# Patient Record
Sex: Male | Born: 1955 | Hispanic: No | Marital: Married
Health system: Southern US, Community
[De-identification: ages and names within clinical notes are randomized; demographics above are authoritative.]

---

## 2019-04-10 ENCOUNTER — Inpatient Hospital Stay (HOSPITAL_COMMUNITY)
Admission: EM | Admit: 2019-04-10 | Discharge: 2019-05-09 | DRG: 296 | Disposition: E | Payer: PRIVATE HEALTH INSURANCE | Attending: Pulmonary Disease | Admitting: Pulmonary Disease

## 2019-04-10 ENCOUNTER — Emergency Department (HOSPITAL_COMMUNITY): Payer: PRIVATE HEALTH INSURANCE

## 2019-04-10 ENCOUNTER — Other Ambulatory Visit: Payer: Self-pay

## 2019-04-10 ENCOUNTER — Inpatient Hospital Stay (HOSPITAL_COMMUNITY): Payer: PRIVATE HEALTH INSURANCE

## 2019-04-10 ENCOUNTER — Encounter (HOSPITAL_COMMUNITY): Payer: Self-pay | Admitting: Emergency Medicine

## 2019-04-10 DIAGNOSIS — R9431 Abnormal electrocardiogram [ECG] [EKG]: Secondary | ICD-10-CM | POA: Diagnosis present

## 2019-04-10 DIAGNOSIS — R0902 Hypoxemia: Secondary | ICD-10-CM

## 2019-04-10 DIAGNOSIS — R739 Hyperglycemia, unspecified: Secondary | ICD-10-CM | POA: Diagnosis present

## 2019-04-10 DIAGNOSIS — J329 Chronic sinusitis, unspecified: Secondary | ICD-10-CM | POA: Diagnosis present

## 2019-04-10 DIAGNOSIS — Z452 Encounter for adjustment and management of vascular access device: Secondary | ICD-10-CM | POA: Diagnosis not present

## 2019-04-10 DIAGNOSIS — J9621 Acute and chronic respiratory failure with hypoxia: Secondary | ICD-10-CM

## 2019-04-10 DIAGNOSIS — Z01818 Encounter for other preprocedural examination: Secondary | ICD-10-CM

## 2019-04-10 DIAGNOSIS — R0600 Dyspnea, unspecified: Secondary | ICD-10-CM | POA: Diagnosis not present

## 2019-04-10 DIAGNOSIS — G931 Anoxic brain damage, not elsewhere classified: Secondary | ICD-10-CM | POA: Diagnosis present

## 2019-04-10 DIAGNOSIS — Z66 Do not resuscitate: Secondary | ICD-10-CM

## 2019-04-10 DIAGNOSIS — E876 Hypokalemia: Secondary | ICD-10-CM | POA: Diagnosis present

## 2019-04-10 DIAGNOSIS — Z20828 Contact with and (suspected) exposure to other viral communicable diseases: Secondary | ICD-10-CM | POA: Diagnosis present

## 2019-04-10 DIAGNOSIS — A419 Sepsis, unspecified organism: Secondary | ICD-10-CM | POA: Diagnosis not present

## 2019-04-10 DIAGNOSIS — Z978 Presence of other specified devices: Secondary | ICD-10-CM

## 2019-04-10 DIAGNOSIS — Z515 Encounter for palliative care: Secondary | ICD-10-CM

## 2019-04-10 DIAGNOSIS — Z79899 Other long term (current) drug therapy: Secondary | ICD-10-CM

## 2019-04-10 DIAGNOSIS — E872 Acidosis, unspecified: Secondary | ICD-10-CM

## 2019-04-10 DIAGNOSIS — R402112 Coma scale, eyes open, never, at arrival to emergency department: Secondary | ICD-10-CM | POA: Diagnosis present

## 2019-04-10 DIAGNOSIS — J690 Pneumonitis due to inhalation of food and vomit: Secondary | ICD-10-CM

## 2019-04-10 DIAGNOSIS — J80 Acute respiratory distress syndrome: Secondary | ICD-10-CM | POA: Diagnosis not present

## 2019-04-10 DIAGNOSIS — J9601 Acute respiratory failure with hypoxia: Secondary | ICD-10-CM | POA: Diagnosis not present

## 2019-04-10 DIAGNOSIS — J9690 Respiratory failure, unspecified, unspecified whether with hypoxia or hypercapnia: Secondary | ICD-10-CM

## 2019-04-10 DIAGNOSIS — N179 Acute kidney failure, unspecified: Secondary | ICD-10-CM

## 2019-04-10 DIAGNOSIS — R680 Hypothermia, not associated with low environmental temperature: Secondary | ICD-10-CM | POA: Diagnosis present

## 2019-04-10 DIAGNOSIS — G932 Benign intracranial hypertension: Secondary | ICD-10-CM | POA: Diagnosis not present

## 2019-04-10 DIAGNOSIS — I469 Cardiac arrest, cause unspecified: Secondary | ICD-10-CM | POA: Diagnosis present

## 2019-04-10 DIAGNOSIS — R6520 Severe sepsis without septic shock: Secondary | ICD-10-CM | POA: Diagnosis not present

## 2019-04-10 DIAGNOSIS — Z91018 Allergy to other foods: Secondary | ICD-10-CM

## 2019-04-10 DIAGNOSIS — R402312 Coma scale, best motor response, none, at arrival to emergency department: Secondary | ICD-10-CM | POA: Diagnosis present

## 2019-04-10 DIAGNOSIS — J189 Pneumonia, unspecified organism: Secondary | ICD-10-CM | POA: Diagnosis not present

## 2019-04-10 DIAGNOSIS — U071 COVID-19: Secondary | ICD-10-CM | POA: Diagnosis not present

## 2019-04-10 DIAGNOSIS — R402212 Coma scale, best verbal response, none, at arrival to emergency department: Secondary | ICD-10-CM | POA: Diagnosis present

## 2019-04-10 DIAGNOSIS — Z8249 Family history of ischemic heart disease and other diseases of the circulatory system: Secondary | ICD-10-CM

## 2019-04-10 DIAGNOSIS — R8271 Bacteriuria: Secondary | ICD-10-CM

## 2019-04-10 DIAGNOSIS — R578 Other shock: Secondary | ICD-10-CM | POA: Diagnosis present

## 2019-04-10 DIAGNOSIS — J188 Other pneumonia, unspecified organism: Secondary | ICD-10-CM

## 2019-04-10 LAB — POCT I-STAT 7, (LYTES, BLD GAS, ICA,H+H)
Acid-Base Excess: 1 mmol/L (ref 0.0–2.0)
Acid-Base Excess: 2 mmol/L (ref 0.0–2.0)
Acid-Base Excess: 1 mmol/L (ref 0.0–2.0)
Acid-base deficit: 2 mmol/L (ref 0.0–2.0)
Acid-base deficit: 1 mmol/L (ref 0.0–2.0)
Bicarbonate: 22.4 mmol/L (ref 20.0–28.0)
Bicarbonate: 21.9 mmol/L (ref 20.0–28.0)
Bicarbonate: 21.8 mmol/L (ref 20.0–28.0)
Bicarbonate: 23 mmol/L (ref 20.0–28.0)
Bicarbonate: 23.2 mmol/L (ref 20.0–28.0)
Bicarbonate: 22.6 mmol/L (ref 20.0–28.0)
Bicarbonate: 24.1 mmol/L (ref 20.0–28.0)
Calcium, Ion: 0.93 mmol/L — ABNORMAL LOW (ref 1.15–1.40)
Calcium, Ion: 0.94 mmol/L — ABNORMAL LOW (ref 1.15–1.40)
Calcium, Ion: 0.87 mmol/L — CL (ref 1.15–1.40)
Calcium, Ion: 0.85 mmol/L — CL (ref 1.15–1.40)
Calcium, Ion: 0.87 mmol/L — CL (ref 1.15–1.40)
Calcium, Ion: 0.86 mmol/L — CL (ref 1.15–1.40)
Calcium, Ion: 0.87 mmol/L — CL (ref 1.15–1.40)
HCT: 37 % — ABNORMAL LOW (ref 39.0–52.0)
HCT: 40 % (ref 39.0–52.0)
HCT: 39 % (ref 39.0–52.0)
HCT: 37 % — ABNORMAL LOW (ref 39.0–52.0)
HCT: 37 % — ABNORMAL LOW (ref 39.0–52.0)
HCT: 34 % — ABNORMAL LOW (ref 39.0–52.0)
HCT: 36 % — ABNORMAL LOW (ref 39.0–52.0)
Hemoglobin: 12.6 g/dL — ABNORMAL LOW (ref 13.0–17.0)
Hemoglobin: 13.6 g/dL (ref 13.0–17.0)
Hemoglobin: 13.3 g/dL (ref 13.0–17.0)
Hemoglobin: 12.6 g/dL — ABNORMAL LOW (ref 13.0–17.0)
Hemoglobin: 12.6 g/dL — ABNORMAL LOW (ref 13.0–17.0)
Hemoglobin: 11.6 g/dL — ABNORMAL LOW (ref 13.0–17.0)
Hemoglobin: 12.2 g/dL — ABNORMAL LOW (ref 13.0–17.0)
O2 Saturation: 100 %
O2 Saturation: 85 %
O2 Saturation: 96 %
O2 Saturation: 98 %
O2 Saturation: 99 %
O2 Saturation: 99 %
O2 Saturation: 97 %
Patient temperature: 32.2
Patient temperature: 32.2
Patient temperature: 32.8
Patient temperature: 33.1
Patient temperature: 33.2
Potassium: 3.5 mmol/L (ref 3.5–5.1)
Potassium: 3 mmol/L — ABNORMAL LOW (ref 3.5–5.1)
Potassium: 2.4 mmol/L — CL (ref 3.5–5.1)
Potassium: 3 mmol/L — ABNORMAL LOW (ref 3.5–5.1)
Potassium: 3.3 mmol/L — ABNORMAL LOW (ref 3.5–5.1)
Potassium: 2.8 mmol/L — ABNORMAL LOW (ref 3.5–5.1)
Potassium: 3.7 mmol/L (ref 3.5–5.1)
Sodium: 132 mmol/L — ABNORMAL LOW (ref 135–145)
Sodium: 134 mmol/L — ABNORMAL LOW (ref 135–145)
Sodium: 136 mmol/L (ref 135–145)
Sodium: 137 mmol/L (ref 135–145)
Sodium: 137 mmol/L (ref 135–145)
Sodium: 139 mmol/L (ref 135–145)
Sodium: 138 mmol/L (ref 135–145)
TCO2: 23 mmol/L (ref 22–32)
TCO2: 23 mmol/L (ref 22–32)
TCO2: 23 mmol/L (ref 22–32)
TCO2: 24 mmol/L (ref 22–32)
TCO2: 24 mmol/L (ref 22–32)
TCO2: 24 mmol/L (ref 22–32)
TCO2: 25 mmol/L (ref 22–32)
pCO2 arterial: 35.1 mmHg (ref 32.0–48.0)
pCO2 arterial: 32.2 mmHg (ref 32.0–48.0)
pCO2 arterial: 21.4 mmHg — ABNORMAL LOW (ref 32.0–48.0)
pCO2 arterial: 23 mmHg — ABNORMAL LOW (ref 32.0–48.0)
pCO2 arterial: 23 mmHg — ABNORMAL LOW (ref 32.0–48.0)
pCO2 arterial: 25.7 mmHg — ABNORMAL LOW (ref 32.0–48.0)
pCO2 arterial: 27.5 mmHg — ABNORMAL LOW (ref 32.0–48.0)
pH, Arterial: 7.413 (ref 7.350–7.450)
pH, Arterial: 7.441 (ref 7.350–7.450)
pH, Arterial: 7.598 — ABNORMAL HIGH (ref 7.350–7.450)
pH, Arterial: 7.59 — ABNORMAL HIGH (ref 7.350–7.450)
pH, Arterial: 7.598 — ABNORMAL HIGH (ref 7.350–7.450)
pH, Arterial: 7.538 — ABNORMAL HIGH (ref 7.350–7.450)
pH, Arterial: 7.537 — ABNORMAL HIGH (ref 7.350–7.450)
pO2, Arterial: 396 mmHg — ABNORMAL HIGH (ref 83.0–108.0)
pO2, Arterial: 48 mmHg — ABNORMAL LOW (ref 83.0–108.0)
pO2, Arterial: 53 mmHg — ABNORMAL LOW (ref 83.0–108.0)
pO2, Arterial: 70 mmHg — ABNORMAL LOW (ref 83.0–108.0)
pO2, Arterial: 84 mmHg (ref 83.0–108.0)
pO2, Arterial: 104 mmHg (ref 83.0–108.0)
pO2, Arterial: 63 mmHg — ABNORMAL LOW (ref 83.0–108.0)

## 2019-04-10 LAB — COMPREHENSIVE METABOLIC PANEL
ALT: 56 U/L — ABNORMAL HIGH (ref 0–44)
AST: 79 U/L — ABNORMAL HIGH (ref 15–41)
Albumin: 3.4 g/dL — ABNORMAL LOW (ref 3.5–5.0)
Alkaline Phosphatase: 64 U/L (ref 38–126)
Anion gap: 22 — ABNORMAL HIGH (ref 5–15)
BUN: 13 mg/dL (ref 8–23)
CO2: 20 mmol/L — ABNORMAL LOW (ref 22–32)
Calcium: 8.2 mg/dL — ABNORMAL LOW (ref 8.9–10.3)
Chloride: 90 mmol/L — ABNORMAL LOW (ref 98–111)
Creatinine, Ser: 1.64 mg/dL — ABNORMAL HIGH (ref 0.61–1.24)
GFR calc Af Amer: 50 mL/min — ABNORMAL LOW (ref >60)
GFR calc non Af Amer: 44 mL/min — ABNORMAL LOW (ref >60)
Glucose, Bld: 253 mg/dL — ABNORMAL HIGH (ref 70–99)
Potassium: 3.9 mmol/L (ref 3.5–5.1)
Sodium: 132 mmol/L — ABNORMAL LOW (ref 135–145)
Total Bilirubin: 0.8 mg/dL (ref 0.3–1.2)
Total Protein: 6.5 g/dL (ref 6.5–8.1)

## 2019-04-10 LAB — BASIC METABOLIC PANEL
Anion gap: 13 (ref 5–15)
Anion gap: 10 (ref 5–15)
BUN: 12 mg/dL (ref 8–23)
BUN: 12 mg/dL (ref 8–23)
CO2: 23 mmol/L (ref 22–32)
CO2: 23 mmol/L (ref 22–32)
Calcium: 6.8 mg/dL — ABNORMAL LOW (ref 8.9–10.3)
Calcium: 6.6 mg/dL — ABNORMAL LOW (ref 8.9–10.3)
Chloride: 99 mmol/L (ref 98–111)
Chloride: 104 mmol/L (ref 98–111)
Creatinine, Ser: 1.13 mg/dL (ref 0.61–1.24)
Creatinine, Ser: 1.01 mg/dL (ref 0.61–1.24)
GFR calc Af Amer: >60 mL/min (ref >60)
GFR calc Af Amer: >60 mL/min (ref >60)
GFR calc non Af Amer: >60 mL/min (ref >60)
GFR calc non Af Amer: >60 mL/min (ref >60)
Glucose, Bld: 150 mg/dL — ABNORMAL HIGH (ref 70–99)
Glucose, Bld: 106 mg/dL — ABNORMAL HIGH (ref 70–99)
Potassium: 3.1 mmol/L — ABNORMAL LOW (ref 3.5–5.1)
Potassium: 3.2 mmol/L — ABNORMAL LOW (ref 3.5–5.1)
Sodium: 135 mmol/L (ref 135–145)
Sodium: 137 mmol/L (ref 135–145)

## 2019-04-10 LAB — URINALYSIS, ROUTINE W REFLEX MICROSCOPIC
Bilirubin Urine: NEGATIVE
Glucose, UA: >=500 mg/dL — AB
Ketones, ur: NEGATIVE mg/dL
Leukocytes,Ua: NEGATIVE
Nitrite: NEGATIVE
Protein, ur: >=300 mg/dL — AB
Specific Gravity, Urine: 1.014 (ref 1.005–1.030)
WBC, UA: >50 WBC/hpf — ABNORMAL HIGH (ref 0–5)
pH: 6 (ref 5.0–8.0)

## 2019-04-10 LAB — CBC WITH DIFFERENTIAL/PLATELET
Abs Immature Granulocytes: 0.44 10*3/uL — ABNORMAL HIGH (ref 0.00–0.07)
Basophils Absolute: 0.1 10*3/uL (ref 0.0–0.1)
Basophils Relative: 1 %
Eosinophils Absolute: 0.1 10*3/uL (ref 0.0–0.5)
Eosinophils Relative: 1 %
HCT: 42.1 % (ref 39.0–52.0)
Hemoglobin: 12.9 g/dL — ABNORMAL LOW (ref 13.0–17.0)
Immature Granulocytes: 4 %
Lymphocytes Relative: 25 %
Lymphs Abs: 2.6 10*3/uL (ref 0.7–4.0)
MCH: 30.9 pg (ref 26.0–34.0)
MCHC: 30.6 g/dL (ref 30.0–36.0)
MCV: 100.7 fL — ABNORMAL HIGH (ref 80.0–100.0)
Monocytes Absolute: 0.6 10*3/uL (ref 0.1–1.0)
Monocytes Relative: 6 %
Neutro Abs: 6.6 10*3/uL (ref 1.7–7.7)
Neutrophils Relative %: 63 %
Platelets: 217 10*3/uL (ref 150–400)
RBC: 4.18 MIL/uL — ABNORMAL LOW (ref 4.22–5.81)
RDW: 13.2 % (ref 11.5–15.5)
WBC: 10.4 10*3/uL (ref 4.0–10.5)
nRBC: 0.7 % — ABNORMAL HIGH (ref 0.0–0.2)

## 2019-04-10 LAB — POCT I-STAT, CHEM 8
BUN: 15 mg/dL (ref 8–23)
Calcium, Ion: 0.86 mmol/L — CL (ref 1.15–1.40)
Chloride: 98 mmol/L (ref 98–111)
Creatinine, Ser: 1 mg/dL (ref 0.61–1.24)
Glucose, Bld: 224 mg/dL — ABNORMAL HIGH (ref 70–99)
HCT: 41 % (ref 39.0–52.0)
Hemoglobin: 13.9 g/dL (ref 13.0–17.0)
Potassium: 2.5 mmol/L — CL (ref 3.5–5.1)
Sodium: 136 mmol/L (ref 135–145)
TCO2: 24 mmol/L (ref 22–32)

## 2019-04-10 LAB — PHOSPHORUS: Phosphorus: <1 mg/dL — CL (ref 2.5–4.6)

## 2019-04-10 LAB — GLUCOSE, CAPILLARY
Glucose-Capillary: 197 mg/dL — ABNORMAL HIGH (ref 70–99)
Glucose-Capillary: 188 mg/dL — ABNORMAL HIGH (ref 70–99)
Glucose-Capillary: 205 mg/dL — ABNORMAL HIGH (ref 70–99)
Glucose-Capillary: 167 mg/dL — ABNORMAL HIGH (ref 70–99)
Glucose-Capillary: 152 mg/dL — ABNORMAL HIGH (ref 70–99)
Glucose-Capillary: 106 mg/dL — ABNORMAL HIGH (ref 70–99)
Glucose-Capillary: 82 mg/dL (ref 70–99)
Glucose-Capillary: 103 mg/dL — ABNORMAL HIGH (ref 70–99)
Glucose-Capillary: 99 mg/dL (ref 70–99)
Glucose-Capillary: 107 mg/dL — ABNORMAL HIGH (ref 70–99)
Glucose-Capillary: 102 mg/dL — ABNORMAL HIGH (ref 70–99)
Glucose-Capillary: 96 mg/dL (ref 70–99)

## 2019-04-10 LAB — APTT
aPTT: 31 seconds (ref 24–36)
aPTT: 102 seconds — ABNORMAL HIGH (ref 24–36)

## 2019-04-10 LAB — HEPARIN LEVEL (UNFRACTIONATED)
Heparin Unfractionated: 0.41 IU/mL (ref 0.30–0.70)
Heparin Unfractionated: 0.65 IU/mL (ref 0.30–0.70)

## 2019-04-10 LAB — TROPONIN I
Troponin I: 0.05 ng/mL (ref <0.03)
Troponin I: 0.09 ng/mL (ref <0.03)
Troponin I: 0.13 ng/mL (ref <0.03)
Troponin I: 0.15 ng/mL (ref <0.03)
Troponin I: 0.12 ng/mL (ref <0.03)

## 2019-04-10 LAB — I-STAT CHEM 8, ED
BUN: 16 mg/dL (ref 8–23)
Calcium, Ion: 0.76 mmol/L — CL (ref 1.15–1.40)
Chloride: 97 mmol/L — ABNORMAL LOW (ref 98–111)
Creatinine, Ser: 1.4 mg/dL — ABNORMAL HIGH (ref 0.61–1.24)
Glucose, Bld: 261 mg/dL — ABNORMAL HIGH (ref 70–99)
HCT: 41 % (ref 39.0–52.0)
Hemoglobin: 13.9 g/dL (ref 13.0–17.0)
Potassium: 3.6 mmol/L (ref 3.5–5.1)
Sodium: 127 mmol/L — ABNORMAL LOW (ref 135–145)
TCO2: 20 mmol/L — ABNORMAL LOW (ref 22–32)

## 2019-04-10 LAB — D-DIMER, QUANTITATIVE: D-Dimer, Quant: 5.85 ug/mL-FEU — ABNORMAL HIGH (ref 0.00–0.50)

## 2019-04-10 LAB — PROCALCITONIN: Procalcitonin: <0.1 ng/mL

## 2019-04-10 LAB — PROTIME-INR
INR: 1.1 (ref 0.8–1.2)
INR: 1.3 — ABNORMAL HIGH (ref 0.8–1.2)
Prothrombin Time: 14.2 seconds (ref 11.4–15.2)
Prothrombin Time: 16.1 seconds — ABNORMAL HIGH (ref 11.4–15.2)

## 2019-04-10 LAB — SARS CORONAVIRUS 2 BY RT PCR (HOSPITAL ORDER, PERFORMED IN ~~LOC~~ HOSPITAL LAB): SARS Coronavirus 2: NEGATIVE

## 2019-04-10 LAB — MAGNESIUM: Magnesium: 1.5 mg/dL — ABNORMAL LOW (ref 1.7–2.4)

## 2019-04-10 LAB — LACTATE DEHYDROGENASE: LDH: 343 U/L — ABNORMAL HIGH (ref 98–192)

## 2019-04-10 LAB — CBG MONITORING, ED: Glucose-Capillary: 203 mg/dL — ABNORMAL HIGH (ref 70–99)

## 2019-04-10 LAB — LACTIC ACID, PLASMA
Lactic Acid, Venous: 10.6 mmol/L (ref 0.5–1.9)
Lactic Acid, Venous: 8.2 mmol/L (ref 0.5–1.9)

## 2019-04-10 LAB — TRIGLYCERIDES: Triglycerides: 123 mg/dL (ref <150)

## 2019-04-10 LAB — HIV ANTIBODY (ROUTINE TESTING W REFLEX): HIV Screen 4th Generation wRfx: NONREACTIVE

## 2019-04-10 LAB — FERRITIN: Ferritin: 163 ng/mL (ref 24–336)

## 2019-04-10 LAB — C-REACTIVE PROTEIN: CRP: 1.2 mg/dL — ABNORMAL HIGH (ref <1.0)

## 2019-04-10 LAB — FIBRINOGEN: Fibrinogen: 436 mg/dL (ref 210–475)

## 2019-04-10 LAB — CREATININE, URINE, RANDOM: Creatinine, Urine: 23.48 mg/dL

## 2019-04-10 LAB — BRAIN NATRIURETIC PEPTIDE: B Natriuretic Peptide: 475.1 pg/mL — ABNORMAL HIGH (ref 0.0–100.0)

## 2019-04-10 LAB — MRSA PCR SCREENING: MRSA by PCR: NEGATIVE

## 2019-04-10 MED ORDER — CISATRACURIUM BOLUS VIA INFUSION
0.1000 mg/kg | Freq: Once | INTRAVENOUS | Status: AC
  Administered 2019-04-10: 8.2 mg via INTRAVENOUS
  Filled 2019-04-10: qty 9

## 2019-04-10 MED ORDER — HEPARIN (PORCINE) 25000 UT/250ML-% IV SOLN
900.0000 [IU]/h | INTRAVENOUS | Status: DC
  Administered 2019-04-10 (×2): 650 [IU]/h via INTRAVENOUS
  Administered 2019-04-11: 17:00:00 550 [IU]/h via INTRAVENOUS
  Administered 2019-04-14: 05:00:00 900 [IU]/h via INTRAVENOUS
  Filled 2019-04-10 (×4): qty 250

## 2019-04-10 MED ORDER — SODIUM BICARBONATE 8.4 % IV SOLN
50.0000 meq | Freq: Once | INTRAVENOUS | Status: AC
  Administered 2019-04-10: 50 meq via INTRAVENOUS

## 2019-04-10 MED ORDER — ROCURONIUM BROMIDE 50 MG/5ML IV SOLN
70.0000 mg | Freq: Once | INTRAVENOUS | Status: AC
  Administered 2019-04-10: 70 mg via INTRAVENOUS

## 2019-04-10 MED ORDER — SODIUM CHLORIDE 0.9 % IV SOLN
INTRAVENOUS | Status: DC | PRN
  Administered 2019-04-10: 250 mL via INTRAVENOUS

## 2019-04-10 MED ORDER — EPINEPHRINE PF 1 MG/ML IJ SOLN
1.0000 mg | Freq: Once | INTRAMUSCULAR | Status: AC
  Administered 2019-04-10: 01:00:00 1 mg via INTRAVENOUS

## 2019-04-10 MED ORDER — ASPIRIN 300 MG RE SUPP
300.0000 mg | RECTAL | Status: AC
  Administered 2019-04-10: 300 mg via RECTAL
  Filled 2019-04-10: qty 1

## 2019-04-10 MED ORDER — FENTANYL BOLUS VIA INFUSION
50.0000 ug | INTRAVENOUS | Status: DC | PRN
  Administered 2019-04-10: 50 ug via INTRAVENOUS
  Filled 2019-04-10: qty 50

## 2019-04-10 MED ORDER — SODIUM CHLORIDE 0.9 % IV SOLN
INTRAVENOUS | Status: DC | PRN
  Administered 2019-04-10: 500 mL via INTRAVENOUS

## 2019-04-10 MED ORDER — NOREPINEPHRINE 4 MG/250ML-% IV SOLN
INTRAVENOUS | Status: AC
  Administered 2019-04-10: 2 ug/min via INTRAVENOUS
  Filled 2019-04-10: qty 250

## 2019-04-10 MED ORDER — POTASSIUM PHOSPHATES 15 MMOLE/5ML IV SOLN
30.0000 mmol | Freq: Once | INTRAVENOUS | Status: AC
  Administered 2019-04-10: 30 mmol via INTRAVENOUS
  Filled 2019-04-10 (×2): qty 10

## 2019-04-10 MED ORDER — VANCOMYCIN HCL 10 G IV SOLR
1500.0000 mg | INTRAVENOUS | Status: DC
  Filled 2019-04-10: qty 1500

## 2019-04-10 MED ORDER — VITAL AF 1.2 CAL PO LIQD
1000.0000 mL | ORAL | Status: DC
  Administered 2019-04-10: 16:00:00 1000 mL

## 2019-04-10 MED ORDER — SODIUM CHLORIDE 0.9 % IV SOLN
INTRAVENOUS | Status: DC
  Administered 2019-04-10 – 2019-04-11 (×3): via INTRAVENOUS

## 2019-04-10 MED ORDER — NOREPINEPHRINE 4 MG/250ML-% IV SOLN
0.0000 ug/min | INTRAVENOUS | Status: DC
  Administered 2019-04-10: 02:00:00 2 ug/min via INTRAVENOUS

## 2019-04-10 MED ORDER — CHLORHEXIDINE GLUCONATE 0.12% ORAL RINSE (MEDLINE KIT)
15.0000 mL | Freq: Two times a day (BID) | OROMUCOSAL | Status: DC
  Administered 2019-04-10 – 2019-04-16 (×13): 15 mL via OROMUCOSAL

## 2019-04-10 MED ORDER — INSULIN ASPART 100 UNIT/ML ~~LOC~~ SOLN
0.0000 [IU] | Freq: Three times a day (TID) | SUBCUTANEOUS | Status: DC
  Administered 2019-04-10: 3 [IU] via SUBCUTANEOUS

## 2019-04-10 MED ORDER — MIDAZOLAM BOLUS VIA INFUSION
2.0000 mg | INTRAVENOUS | Status: DC | PRN
  Administered 2019-04-10 (×3): 2 mg via INTRAVENOUS
  Filled 2019-04-10: qty 2

## 2019-04-10 MED ORDER — VANCOMYCIN HCL 10 G IV SOLR
1500.0000 mg | Freq: Once | INTRAVENOUS | Status: AC
  Administered 2019-04-10: 1500 mg via INTRAVENOUS
  Filled 2019-04-10: qty 1500

## 2019-04-10 MED ORDER — ETOMIDATE 2 MG/ML IV SOLN
20.0000 mg | Freq: Once | INTRAVENOUS | Status: AC
  Administered 2019-04-10: 20 mg via INTRAVENOUS

## 2019-04-10 MED ORDER — ORAL CARE MOUTH RINSE
15.0000 mL | OROMUCOSAL | Status: DC
  Administered 2019-04-10 – 2019-04-16 (×61): 15 mL via OROMUCOSAL

## 2019-04-10 MED ORDER — SODIUM CHLORIDE 0.9 % IV BOLUS
1000.0000 mL | Freq: Once | INTRAVENOUS | Status: AC
  Administered 2019-04-10: 02:00:00 1000 mL via INTRAVENOUS

## 2019-04-10 MED ORDER — FENTANYL 2500MCG IN NS 250ML (10MCG/ML) PREMIX INFUSION
100.0000 ug/h | INTRAVENOUS | Status: DC
  Administered 2019-04-10: 100 ug/h via INTRAVENOUS
  Administered 2019-04-10 – 2019-04-12 (×5): 300 ug/h via INTRAVENOUS
  Filled 2019-04-10 (×6): qty 250

## 2019-04-10 MED ORDER — SODIUM CHLORIDE 0.9 % IV SOLN
1.0000 ug/kg/min | INTRAVENOUS | Status: DC
  Administered 2019-04-10: 06:00:00 1 ug/kg/min via INTRAVENOUS
  Administered 2019-04-11: 1.5 ug/kg/min via INTRAVENOUS
  Filled 2019-04-10 (×2): qty 20

## 2019-04-10 MED ORDER — MIDAZOLAM 50MG/50ML (1MG/ML) PREMIX INFUSION
2.0000 mg/h | INTRAVENOUS | Status: DC
  Administered 2019-04-10: 10 mg/h via INTRAVENOUS
  Administered 2019-04-10: 4 mg/h via INTRAVENOUS
  Administered 2019-04-10: 10 mg/h via INTRAVENOUS
  Administered 2019-04-10: 06:00:00 2 mg/h via INTRAVENOUS
  Administered 2019-04-11 (×5): 10 mg/h via INTRAVENOUS
  Administered 2019-04-12: 4 mg/h via INTRAVENOUS
  Filled 2019-04-10 (×10): qty 50

## 2019-04-10 MED ORDER — POTASSIUM CHLORIDE 10 MEQ/50ML IV SOLN
10.0000 meq | INTRAVENOUS | Status: AC
  Administered 2019-04-10 (×6): 10 meq via INTRAVENOUS
  Filled 2019-04-10 (×6): qty 50

## 2019-04-10 MED ORDER — PIPERACILLIN-TAZOBACTAM 3.375 G IVPB
3.3750 g | Freq: Three times a day (TID) | INTRAVENOUS | Status: DC
  Administered 2019-04-10 – 2019-04-16 (×19): 3.375 g via INTRAVENOUS
  Filled 2019-04-10 (×17): qty 50

## 2019-04-10 MED ORDER — NOREPINEPHRINE 4 MG/250ML-% IV SOLN
0.0000 ug/min | INTRAVENOUS | Status: DC
  Administered 2019-04-10 (×4): 6 ug/min via INTRAVENOUS
  Filled 2019-04-10 (×2): qty 250

## 2019-04-10 MED ORDER — HEPARIN BOLUS VIA INFUSION
1000.0000 [IU] | Freq: Once | INTRAVENOUS | Status: AC
  Administered 2019-04-10: 1000 [IU] via INTRAVENOUS
  Filled 2019-04-10: qty 1000

## 2019-04-10 MED ORDER — PIPERACILLIN-TAZOBACTAM 3.375 G IVPB 30 MIN: 3.3750 g | Freq: Three times a day (TID) | INTRAVENOUS | Status: DC

## 2019-04-10 MED ORDER — PIPERACILLIN-TAZOBACTAM 3.375 G IVPB 30 MIN
3.3750 g | Freq: Once | INTRAVENOUS | Status: AC
  Administered 2019-04-10: 03:00:00 3.375 g via INTRAVENOUS
  Filled 2019-04-10: qty 50

## 2019-04-10 MED ORDER — CHLORHEXIDINE GLUCONATE CLOTH 2 % EX PADS
6.0000 | MEDICATED_PAD | Freq: Every day | CUTANEOUS | Status: DC
  Administered 2019-04-10: 6 via TOPICAL

## 2019-04-10 MED ORDER — CISATRACURIUM BOLUS VIA INFUSION
0.0500 mg/kg | INTRAVENOUS | Status: DC | PRN
  Filled 2019-04-10: qty 5

## 2019-04-10 MED ORDER — STERILE WATER FOR INJECTION IV SOLN
INTRAVENOUS | Status: DC
  Administered 2019-04-10 (×2): via INTRAVENOUS
  Filled 2019-04-10: qty 850

## 2019-04-10 MED ORDER — MIDAZOLAM HCL 2 MG/2ML IJ SOLN
2.0000 mg | Freq: Once | INTRAMUSCULAR | Status: AC
  Administered 2019-04-10: 2 mg via INTRAVENOUS
  Filled 2019-04-10: qty 2

## 2019-04-10 MED ORDER — ARTIFICIAL TEARS OPHTHALMIC OINT
1.0000 "application " | TOPICAL_OINTMENT | Freq: Three times a day (TID) | OPHTHALMIC | Status: DC
  Administered 2019-04-10 – 2019-04-16 (×17): 1 via OPHTHALMIC
  Filled 2019-04-10 (×2): qty 3.5

## 2019-04-10 MED ORDER — INSULIN ASPART 100 UNIT/ML ~~LOC~~ SOLN
0.0000 [IU] | SUBCUTANEOUS | Status: DC
  Administered 2019-04-11: 2 [IU] via SUBCUTANEOUS
  Administered 2019-04-13: 3 [IU] via SUBCUTANEOUS
  Administered 2019-04-13 (×2): 2 [IU] via SUBCUTANEOUS
  Administered 2019-04-13: 3 [IU] via SUBCUTANEOUS
  Administered 2019-04-14: 2 [IU] via SUBCUTANEOUS
  Administered 2019-04-14: 3 [IU] via SUBCUTANEOUS
  Administered 2019-04-14 – 2019-04-15 (×5): 2 [IU] via SUBCUTANEOUS
  Administered 2019-04-15: 3 [IU] via SUBCUTANEOUS
  Administered 2019-04-15 – 2019-04-16 (×5): 2 [IU] via SUBCUTANEOUS
  Administered 2019-04-16: 3 [IU] via SUBCUTANEOUS

## 2019-04-10 MED ORDER — FENTANYL CITRATE (PF) 100 MCG/2ML IJ SOLN
100.0000 ug | Freq: Once | INTRAMUSCULAR | Status: AC
  Administered 2019-04-10: 100 ug via INTRAVENOUS
  Filled 2019-04-10: qty 2

## 2019-04-10 NOTE — ED Notes (Signed)
CPR initiated , pt. pulseless - chest compressions started .

## 2019-04-10 NOTE — ED Notes (Signed)
Son Dayna Barker, phone [phone] pt is here outside waiting area

## 2019-04-10 NOTE — Progress Notes (Signed)
Attempt for left radial arterial line insertion unsuccessful. Got good blood return unable to get catheter to thread through arterial lumen.

## 2019-04-10 NOTE — ED Notes (Signed)
Dr. Sol Passer ( admitting ) advised RN that cooling can be started once pt.is in ICU .

## 2019-04-10 NOTE — Progress Notes (Signed)
ABG collected  

## 2019-04-10 NOTE — Procedures (Signed)
Arterial Catheter Insertion Procedure Note Sarthak Tonn [id number] [birth year]  Procedure: Insertion of Arterial Catheter  Indications: Blood pressure monitoring and Frequent blood sampling  Procedure Details Consent: Risks of procedure as well as the alternatives and risks of each were explained to the (patient/caregiver).  Consent for procedure obtained. and Unable to obtain consent because of emergent medical necessity. Time Out: Verified patient identification, verified procedure, site/side was marked, verified correct patient position, special equipment/implants available, medications/allergies/relevent history reviewed, required imaging and test results available.  Performed  Maximum sterile technique was used including antiseptics, cap, gloves, gown, hand hygiene and mask. Skin prep: Chlorhexidine; local anesthetic administered 20 gauge catheter was inserted into right radial artery using the Seldinger technique. ULTRASOUND GUIDANCE USED: YES Evaluation Blood flow good; BP tracing good. Complications: No apparent complications.   Leafy Half 2019/04/20

## 2019-04-10 NOTE — Procedures (Signed)
ELECTROENCEPHALOGRAM REPORT   Patient: Adam Austin       Room #: [facility] EEG No. ID: [id number] Age: 63 y.o.        Sex: male Referring Physician: Rande Lawman Report Date:  04/25/2019        Interpreting Physician: Thana Farr  History: [name] is an 63 y.o. male s/p arrest  Medications:  Nimbex, Fentanyl, Heparin, Versed, Levophed, Vancomycin, Insulin, Zosyn  Conditions of Recording:  This is a 21 channel routine scalp EEG performed with bipolar and monopolar montages arranged in accordance to the international 10/20 system of electrode placement. One channel was dedicated to EKG recording.  The patient is in the intubated and sedated state.  Patient undergoing hypothermia protocol.  Description:  The background activity is severely attenuated throughout.  Some lead artifact is noted in the left temporal region.  When the sensitivity is increased the 5uV/mm from the standard 7uV/mm there is no significant improvement in activity noted.   The patient is on a paralytic so not stimulation was applied.   Hyperventilation and intermittent photic stimulation were not performed.  IMPRESSION: This is a severely attenuated electroencephalogram consistent with sedation and hypothermia.     Thana Farr, MD Neurology [phone] 05/01/2019, 1:44 PM

## 2019-04-10 NOTE — Progress Notes (Signed)
Noted visual aspiration during intubation. Sure patient aspirated prior to intubation. On arrival patient had emesis all over head, face, and hair and was unresponsive.

## 2019-04-10 NOTE — Progress Notes (Addendum)
Inpatient Diabetes Program Recommendations  AACE/ADA: New Consensus Statement on Inpatient Glycemic Control (2015)  Target Ranges:  Prepandial:   less than 140 mg/dL      Peak postprandial:   less than 180 mg/dL (1-2 hours)      Critically ill patients:  140 - 180 mg/dL   Lab Results  Component Value Date   GLUCAP 152 (H) 05/05/2019    Review of Glycemic Control  Current orders for Inpatient glycemic control: Novolog 0-15 units tid  Inpatient Diabetes Program Recommendations:    Due to critical nature of patient please d/c current order set   Order ICU Glycemic Control Orders set Phase 1 SQ insulin MODERATE Novolog 2-6 units Q4 hours.   Correction scale currently ordered tid.  Paged Nehemiah Settle, NP in regards to plan of care.  Thanks,  Christena Deem RN, MSN, BC-ADM Inpatient Diabetes Coordinator Team Pager [phone] (8a-5p)

## 2019-04-10 NOTE — Progress Notes (Signed)
PCCM Interval Note   68 yoM visiting from [place] with witnessed collapse.  Found to be PEA arrest with ROSC after 10-15 mins.  Had vomiting enroute to hospital with significant aspiration.  TTM 33 protocol initiated.     Per RN went below 33 to 31 celsius, slow rewarming back to 33.  Remains on fentanyl 200 mcg/min, versed drip 4 mg/hr, nimbex at 1.5, Bicarb gtt, and levophed at 6 mcg/min.  Ccollar since applied.   Labs reviewed.  ABG at 1000 7.59/ 23/ 70/ 24 on PRVC rate 15/ TV 400/ PEEP 10/ FiO2 1.0  Patient noted to be breathing over set MV rate.   Blood pressure 109/77, pulse 70, temperature (!) 90.9 F (32.7 C), temperature source Esophageal, resp. rate 18, height 5\' 5"  (1.651 m), weight 62.5 kg, SpO2 100 %.   P:  Continue vanc/ zosyn abx  Continue levophed for MAP goal >65 Stop bicarb gtt.  Increase sedation.  Recheck ABG in 1 hour Unable to wean FiO2/ PEEP at this time Check CVP  Pending EEG and TTE Continuing TTM protocol   Will update family.    CCT 30 mins  Posey Boyer, MSN, AGACNP-BC [facility] Pgr: [phone] or if no answer [phone] May 08, 2019, 11:26 AM

## 2019-04-10 NOTE — ED Notes (Signed)
Family notified on pt.'s condition and admission plan by EDP/RN.

## 2019-04-10 NOTE — ED Notes (Signed)
Ice packs applied to bilateral groin and axilla.

## 2019-04-10 NOTE — Progress Notes (Signed)
ANTICOAGULATION CONSULT NOTE - Initial Consult  Pharmacy Consult for heparin Indication: chest pain/ACS  No Known Allergies  Patient Measurements: Height: 5\' 5"  (165.1 cm) Weight: 180 lb (81.6 kg) IBW/kg (Calculated) : 61.5 Heparin Dosing Weight: 81.6 Vital Signs: Temp: 96 F (35.6 C) (06/02 0120) Temp Source: Temporal (06/02 0120) BP: 121/92 (06/02 0401) Pulse Rate: 95 (06/02 0401)  Labs: Recent Labs    04/09/2019 0154 05/03/2019 0218 04/09/2019 0248  HGB 13.9 12.9* 12.6*  HCT 41.0 42.1 37.0*  PLT  --  217  --   CREATININE 1.40* 1.64*  --   TROPONINI  --  0.05*  --     Estimated Creatinine Clearance: 44.7 mL/min (A) (by C-G formula based on SCr of 1.64 mg/dL (H)).   Medical History: History reviewed. No pertinent past medical history.  Medications:  Med rec pending  Assessment: 63 yo man to start heparin for ACS.  He will also be on cooling protocol.  No PMH available.  Baseline Hg 12.6, PTLC 217 Goal of Therapy:  Heparin level 0.3-0.7 units/ml Monitor platelets by anticoagulation protocol: Yes   Plan:  Heparin bolus 1000 units and drip at 650 units/hr Check heparin level 6 hours after start Daily HL and CBC while on heparin Monitor for bleeding complications  Adam Austin 05/06/2019,4:20 AM

## 2019-04-10 NOTE — Procedures (Signed)
Central Venous Catheter Insertion Procedure Note Jotham Fickett [id number] 12-Mar-1956  Procedure: Insertion of Central Venous Catheter Indications: Assessment of intravascular volume, Drug and/or fluid administration and Frequent blood sampling  Procedure Details Consent: Unable to obtain consent because of emergent medical necessity. Time Out: Verified patient identification, verified procedure, site/side was marked, verified correct patient position, special equipment/implants available, medications/allergies/relevent history reviewed, required imaging and test results available.  Performed  Maximum sterile technique was used including antiseptics, cap, gloves, gown, hand hygiene, mask and sheet. Skin prep: Chlorhexidine; local anesthetic administered A antimicrobial bonded/coated triple lumen catheter was placed in the left internal jugular vein using the Seldinger technique.  Evaluation Blood flow good Complications: No apparent complications Patient did tolerate procedure well. Chest X-ray ordered to verify placement.  CXR: pending.  Procedure performed under direct ultrasound guidance for real time vessel cannulation.      Rutherford Guys, [place] [facility] Pager: [phone]  or [phone] 04/20/2019, 6:41 AM

## 2019-04-10 NOTE — Progress Notes (Signed)
eLink Physician-Brief Progress Note Patient Name: Phineas Curtis DOB: 01-Feb-1956 MRN: [id number]   Date of Service  04/09/2019  HPI/Events of Note  Multiple issues: 1. K+ = 2.5 and Creatinine = 1.0 and 2. Review CXR for line placement - L IJ CVL tip at innominate -SVC junction. No pneumothorax.   eICU Interventions  Will order: 1. Replace K+.  2. L IJ CVL Ok to use.      Intervention Category Major Interventions: Electrolyte abnormality - evaluation and management Intermediate Interventions: Diagnostic test evaluation  Rosemaria Inabinet Eugene 04/21/2019, 7:10 AM

## 2019-04-10 NOTE — Progress Notes (Signed)
eLink Physician-Brief Progress Note Patient Name: Florencio Peele DOB: June 04, 1956 MRN: [id number]   Date of Service  04/15/2019  HPI/Events of Note  Hypophosphatemia and hypokalemia - PO4--- < 1.0, K+ = 3.2 and Creatinine = 1.01.   eICU Interventions  Will replace K+ and PO4---/      Intervention Category Major Interventions: Electrolyte abnormality - evaluation and management  Sommer,Steven Eugene 04/12/2019, 8:49 PM

## 2019-04-10 NOTE — H&P (Signed)
NAMERomio Austin, MRN:  [id number], DOB:  1956/02/18, LOS: 0 ADMISSION DATE:  05/04/2019, CONSULTATION DATE: 04/18/2019 REFERRING MD:  ER, CHIEF COMPLAINT: Status post cardiac arrest  Brief History   63 year old post witnessed arrest  History of present illness   Patient is an otherwise healthy 63 year old gentleman visiting from [place] who had a witnessed collapse at about 1130 last night.  CPR was started by EMTs after about 10 or 15 minutes.  He was found to be in PEA and ultimately was resuscitated.  He had a King airway placed at the scene.  Unfortunately in route to the hospital he vomited and had probable significant aspiration with vomitus over his face and significant consolidation of both mid lung fields right greater than left.  Spoke with the patient's son-in-law this evening family requests aggressive care.  His son in law tells me that he has never had any significant major medical problems hospitalizations chronic medications that he takes on a regular basis.  Past Medical History  Unremarkable  Significant Hospital Events   Unremarkable/unknown  Consults:  None  Procedures:  Intubation central line  Significant Diagnostic Tests:  COVID is pending Chest x-ray shows bilateral aspiration EKG is normal sinus rhythm with prolonged QT interval  Micro Data:  NA  Antimicrobials:  Vanco and Zosyn has been started  Interim history/subjective:  NA  Objective   Blood pressure (!) 121/92, pulse 95, temperature (!) 96 F (35.6 C), temperature source Temporal, resp. rate 20, height 5\' 5"  (1.651 m), weight 81.6 kg, SpO2 96 %.    Vent Mode: PRVC FiO2 (%):  [50 %-100 %] 50 % Set Rate:  [24 bmp] 24 bmp Vt Set:  [490 mL] 490 mL PEEP:  [10 cmH20] 10 cmH20 Plateau Pressure:  [36 cmH20] 36 cmH20  No intake or output data in the 24 hours ending 04/13/2019 0418 Filed Weights   05/03/2019 0114  Weight: 81.6 kg    Examination: General: Diminutive [place] male intubated  unresponsive HENT: Within normal limits Lungs: Coarse rhonchus breath sounds bilaterally Cardiovascular: Regular but distant Abdomen: Benign Extremities: Within normal limits Neuro: Sedated/obtunded GU: Within normal limits  Resolved Hospital Problem list   NA  Assessment & Plan:  1.  Status post cardiac arrest: Will obtain CT scan of the head and if negative will begin cooling protocol.  Patient seems to be just inside of an acceptable 30-minute window from the time of witnessed collapse till resuscitation was successful.  Echocardiogram in the a.m.  2.  Aspiration pneumonia: We will continue initiated vancomycin and Zosyn.  3.  Will await COVID testing  4.  Respiratory failure: Continue mechanical ventilation  5.  Prolonged QT interval: We will need to watch associated medications that might worsen this  6.  Elevated lactic acid (10.6): We will continue to monitor BMP/bicarb drip  7.  Hypotension: We will continue with volume expansion with judicious use of vasopressors  8.  Hyperglycemia: Sliding scale insulin Best practice:  Diet: N.p.o. Pain/Anxiety/Delirium protocol (if indicated): Fentanyl continuous VAP protocol (if indicated): Yes DVT prophylaxis: Yes GI prophylaxis: Pepcid Glucose control: Monitor Mobility: Bedrest Code Status: Full Family Communication: Discussed with son-in-law Disposition:   Labs   CBC: Recent Labs  Lab 04/13/2019 0154 05/08/2019 0218 05/08/2019 0248  WBC  --  10.4  --   NEUTROABS  --  6.6  --   HGB 13.9 12.9* 12.6*  HCT 41.0 42.1 37.0*  MCV  --  100.7*  --   PLT  --  217  --     Basic Metabolic Panel: Recent Labs  Lab 04/24/2019 0154 05/06/2019 0218 04/09/2019 0248  NA 127* 132* 132*  K 3.6 3.9 3.5  CL 97* 90*  --   CO2  --  20*  --   GLUCOSE 261* 253*  --   BUN 16 13  --   CREATININE 1.40* 1.64*  --   CALCIUM  --  8.2*  --    GFR: Estimated Creatinine Clearance: 44.7 mL/min (A) (by C-G formula based on SCr of 1.64 mg/dL (H)).  Recent Labs  Lab 04/15/2019 0218  PROCALCITON <0.10  WBC 10.4  LATICACIDVEN 10.6*    Liver Function Tests: Recent Labs  Lab 04/19/2019 0218  AST 79*  ALT 56*  ALKPHOS 64  BILITOT 0.8  PROT 6.5  ALBUMIN 3.4*   No results for input(s): LIPASE, AMYLASE in the last 168 hours. No results for input(s): AMMONIA in the last 168 hours.  ABG    Component Value Date/Time   PHART 7.413 05/06/2019 0248   PCO2ART 35.1 04/17/2019 0248   PO2ART 396.0 (H) 04/19/2019 0248   HCO3 22.4 04/26/2019 0248   TCO2 23 04/27/2019 0248   ACIDBASEDEF 2.0 04/09/2019 0248   O2SAT 100.0 04/19/2019 0248     Coagulation Profile: No results for input(s): INR, PROTIME in the last 168 hours.  Cardiac Enzymes: Recent Labs  Lab 05/03/2019 0218  TROPONINI 0.05*    HbA1C: No results found for: HGBA1C  CBG: Recent Labs  Lab 04/13/2019 0139  GLUCAP 203*    Review of Systems:   Unobtainable  Past Medical History  He,  has no past medical history on file.   Surgical History   History reviewed. No pertinent surgical history.   Social History      Family History   His family history is not on file.   Allergies No Known Allergies   Home Medications  Prior to Admission medications   Not on File     Critical care time: Over 35 minutes was spent in evaluation critical care planning including bedside evaluation and chart review

## 2019-04-10 NOTE — Progress Notes (Signed)
Patient PIP are high in the 50's. Pressure Control mode of ventilation was tried patient still was not adequately ventilating. Tidal Volume was decrease to attempt to decrease peak airway pressure unsuccessful. MD Williams aware.

## 2019-04-10 NOTE — Progress Notes (Signed)
ANTICOAGULATION CONSULT NOTE   Pharmacy Consult for heparin Indication: chest pain/ACS  No Known Allergies  Patient Measurements: Height: 5\' 5"  (165.1 cm) Weight: 137 lb 12.6 oz (62.5 kg) IBW/kg (Calculated) : 61.5 Heparin Dosing Weight: 81.6 Vital Signs: Temp: 90.9 F (32.7 C) (06/02 1300) Temp Source: Esophageal (06/02 1300) BP: 98/76 (06/02 1300) Pulse Rate: 72 (06/02 1300)  Labs: Recent Labs    05/05/2019 0218  04/28/2019 0417  04/19/2019 0644 04/13/2019 0801 04/13/2019 0803 05/01/2019 1005 05/01/2019 1024 04/26/2019 1137 04/19/2019 1142 04/24/2019 1238  HGB 12.9*   < >  --    < > 13.9  --  13.3 12.6*  --   --   --  12.6*  HCT 42.1   < >  --    < > 41.0  --  39.0 37.0*  --   --   --  37.0*  PLT 217  --   --   --   --   --   --   --   --   --   --   --   APTT  --   --  31  --   --   --   --   --   --   --  102*  --   LABPROT  --   --  14.2  --   --   --   --   --   --   --  16.1*  --   INR  --   --  1.1  --   --   --   --   --   --   --  1.3*  --   HEPARINUNFRC  --   --   --   --   --   --   --   --   --  0.41  --   --   CREATININE 1.64*  --   --   --  1.00  --   --   --  1.13  --   --   --   TROPONINI 0.05*  --  0.09*  --   --  0.13*  --   --   --   --   --   --    < > = values in this interval not displayed.    Estimated Creatinine Clearance: 57.4 mL/min (by C-G formula based on SCr of 1.13 mg/dL).   Medical History: History reviewed. No pertinent past medical history.  Medications:  Med rec pending  Assessment: 63 yo man to start heparin for ACS.  He will also be on cooling protocol.  No PMH available.  Baseline Hg 12.6, PTLC 217  Initial heparin level at goal.  No overt bleeding or complications note, H/H stable.  Goal of Therapy:  Heparin level 0.3-0.7 units/ml Monitor platelets by anticoagulation protocol: Yes   Plan:  Continue IV heparin at current rate. Recheck heparin level in 6 hrs. Will need to check heparin level about every 4 hrs once starts to  rewarm.  Jenetta Downer, [facility] Clinical Pharmacist Phone [phone]  04/20/2019 1:11 PM

## 2019-04-10 NOTE — Progress Notes (Signed)
Initial Nutrition Assessment  DOCUMENTATION CODES:   Not applicable  INTERVENTION:   If unable to extubate in 24-48 hours  Tube feeding:  Vital AF 1.2 @ 20 ml/hr via OGT Increase by 10 ml Q8 hrs to goal rate of 55 ml/hr (1320 ml)  At goal TF provides: 1584 kcals, 99 grams protein, 1071 ml free water. Meets 107% kcal needs and 100% protein needs.   NUTRITION DIAGNOSIS:   Inadequate oral intake related to inability to eat as evidenced by NPO status.  GOAL:   Provide needs based on ASPEN/SCCM guidelines  MONITOR:   Diet advancement, Weight trends, Labs, Skin, Vent status, TF tolerance, I & O's  REASON FOR ASSESSMENT:   Ventilator    ASSESSMENT:   Patient without PMH documented (visiting from [place]). Presents this admission s/p PEA arrest. Noted to have vomiting episode enroute with severe aspiration.    RD working remotely.  Pt currently in C-collar (intubation trauma?), awaiting cervical spine CT results. On paralytics with TTM. Not requiring pressors. Will discuss feeding plan with CCM.   Weight of 81.6 kg looks to have been estimated in ED. Will utilize EDW of 62.5 kg to estimate needs.   Patient is currently intubated on ventilator support MV: 9.4 L/min Temp (24hrs), Avg:91.5 F (33.1 C), Min:89.2 F (31.8 C), Max:96 F (35.6 C)  I/O: +1,571 ml since admit UOP: 300 ml x 24 hrs   Drips: NS @ 75 ml/hr, nimbex, versed, levophed, 10 mEq KCl Medications: SS novolog Labs: K 3.3 (L) calcium ionized 0.87 (L) CBG 150    Diet Order:   Diet Order            Diet NPO time specified  Diet effective now              EDUCATION NEEDS:   Not appropriate for education at this time  Skin:  Skin Assessment: Reviewed RN Assessment  Last BM:  PTA  Height:   Ht Readings from Last 1 Encounters:  May 09, 2019 5\' 5"  (1.651 m)    Weight:   Wt Readings from Last 1 Encounters:  05-09-19 62.5 kg    Ideal Body Weight:  61.8 kg  BMI:  Body mass index is 22.93  kg/m.  Estimated Nutritional Needs:   Kcal:  1480 kcal  Protein:  95-115 grams  Fluid:  >/= 1.5 L/day    Vanessa Kick RD, LDN Clinical Nutrition Pager # - [phone]

## 2019-04-10 NOTE — Progress Notes (Signed)
EEG complete - results pending 

## 2019-04-10 NOTE — ED Triage Notes (Signed)
Patient arrived with EMS from home family reported worsening SOB this evening then became unresponsive , CPR initiated by EMS received 2 doses of Epinephrine 1 mg prior to arrival , being paced at arrival , CBG= 176 , intubated by EDP at arrival .

## 2019-04-10 NOTE — ED Notes (Signed)
Pulse check = pulses present , ETT intact , NS IV bolus infusing .

## 2019-04-10 NOTE — Progress Notes (Signed)
PCCM Interval Note   Patient's family- son-in-law, Dayna Barker at [phone], updated by phone. Patient's wife does not speak [place].  Patient, himself, does not speak and understands very little [place] per [place].    Updated family on patient's condition and that unfortunately will not have a neuro prognosis till likely Thursday after rewarming and holding sedation.  Family wanting to visit, advised we could set up a virtual visit with family.  Ongoing support offered.    Posey Boyer, MSN, AGACNP-BC [facility] Pgr: [phone] or if no answer [phone] 05/05/2019, 1:39 PM

## 2019-04-10 NOTE — Progress Notes (Signed)
Got a call from radiology with questionable shadow around C2.  And that we not sure as to how traumatic attempts at intubation in the field might be we are going to apply a hard collar until cervical spine CT can be obtained.  Otherwise there was no evidence of intracranial hemorrhage and cooling protocol can proceed as planned.

## 2019-04-10 NOTE — ED Notes (Signed)
ED TO INPATIENT HANDOFF REPORT  ED Nurse Name and Phone #:  Molly Maduro RN [phone]  S Name/Age/Gender Adam Austin 63 y.o. male Room/Bed: RESUSC/RESUSC  Code Status : Full   Home/SNF/Other : HOME  Is this baseline? no  Triage Complete: Triage complete  Chief Complaint Cardiac Arrest   Triage Note Patient arrived with EMS from home family reported worsening SOB this evening then became unresponsive , CPR initiated by EMS received 2 doses of Epinephrine 1 mg prior to arrival , being paced at arrival , CBG= 176 , intubated by EDP at arrival .    Allergies No Known Allergies  Level of Care/Admitting Diagnosis ED Disposition    None      B Medical/Surgery History History reviewed. No pertinent past medical history. History reviewed. No pertinent surgical history.   A IV Location/Drains/Wounds Patient Lines/Drains/Airways Status   Active Line/Drains/Airways    Name:   Placement date:   Placement time:   Site:   Days:   Peripheral IV 04-23-2019 Left Antecubital   Apr 23, 2019    0124    Antecubital   less than 1   Peripheral IV 04-23-19 Right Hand   2019-04-23    -    Hand   less than 1   NG/OG Tube Orogastric 18 Fr. Center mouth   Apr 23, 2019    0141    Center mouth   less than 1   Urethral Catheter Non-latex;Temperature probe 16 Fr.   Apr 23, 2019    0217    Non-latex;Temperature probe   less than 1   Airway 7.5 mm   04/23/19    0150     less than 1   Intraosseous Line 04-23-2019 Tibia   04-23-2019    0001    Left   less than 1          Intake/Output Last 24 hours No intake or output data in the 24 hours ending 04/23/19 0358  Labs/Imaging Results for orders placed or performed during the hospital encounter of Apr 23, 2019 (from the past 48 hour(s))  CBG monitoring, ED     Status: Abnormal   Collection Time: 04/23/19  1:39 AM  Result Value Ref Range   Glucose-Capillary 203 (H) 70 - 99 mg/dL  SARS Coronavirus 2 [facility] order, Performed in [facility] hospital lab)     Status:  None   Collection Time: 04-23-2019  1:45 AM  Result Value Ref Range   SARS Coronavirus 2 NEGATIVE NEGATIVE    Comment: (NOTE) If result is NEGATIVE SARS-CoV-2 target nucleic acids are NOT DETECTED. The SARS-CoV-2 RNA is generally detectable in upper and lower  respiratory specimens during the acute phase of infection. The lowest  concentration of SARS-CoV-2 viral copies this assay can detect is 250  copies / mL. A negative result does not preclude SARS-CoV-2 infection  and should not be used as the sole basis for treatment or other  patient management decisions.  A negative result may occur with  improper specimen collection / handling, submission of specimen other  than nasopharyngeal swab, presence of viral mutation(s) within the  areas targeted by this assay, and inadequate number of viral copies  (<250 copies / mL). A negative result must be combined with clinical  observations, patient history, and epidemiological information. If result is POSITIVE SARS-CoV-2 target nucleic acids are DETECTED. The SARS-CoV-2 RNA is generally detectable in upper and lower  respiratory specimens dur ing the acute phase of infection.  Positive  results are indicative of active infection  with SARS-CoV-2.  Clinical  correlation with patient history and other diagnostic information is  necessary to determine patient infection status.  Positive results do  not rule out bacterial infection or co-infection with other viruses. If result is PRESUMPTIVE POSTIVE SARS-CoV-2 nucleic acids MAY BE PRESENT.   A presumptive positive result was obtained on the submitted specimen  and confirmed on repeat testing.  While 2019 novel coronavirus  (SARS-CoV-2) nucleic acids may be present in the submitted sample  additional confirmatory testing may be necessary for epidemiological  and / or clinical management purposes  to differentiate between  SARS-CoV-2 and other Sarbecovirus currently known to infect humans.  If  clinically indicated additional testing with an alternate test  methodology [phone]) is advised. The SARS-CoV-2 RNA is generally  detectable in upper and lower respiratory sp ecimens during the acute  phase of infection. The expected result is Negative. Fact Sheet for Patients:  BoilerBrush.com.cy Fact Sheet for Healthcare Providers: https://pope.com/ This test is not yet approved or cleared by the [place] FDA and has been authorized for detection and/or diagnosis of SARS-CoV-2 by FDA under an Emergency Use Authorization (EUA).  This EUA will remain in effect (meaning this test can be used) for the duration of the COVID-19 declaration under Section 564(b)(1) of the Act, 21 U.S.C. section 360bbb-3(b)(1), unless the authorization is terminated or revoked sooner. Performed at [facility], [address] [place], [place] [address]   I-stat chem 8, ED [facility] and [facility] only)     Status: Abnormal   Collection Time: 05/02/2019  1:54 AM  Result Value Ref Range   Sodium 127 (L) 135 - 145 mmol/L   Potassium 3.6 3.5 - 5.1 mmol/L   Chloride 97 (L) 98 - 111 mmol/L   BUN 16 8 - 23 mg/dL   Creatinine, Ser 8.11 (H) 0.61 - 1.24 mg/dL   Glucose, Bld 914 (H) 70 - 99 mg/dL   Calcium, Ion 7.82 (LL) 1.15 - 1.40 mmol/L   TCO2 20 (L) 22 - 32 mmol/L   Hemoglobin 13.9 13.0 - 17.0 g/dL   HCT 95.6 21.3 - 08.6 %   Comment NOTIFIED PHYSICIAN   Brain natriuretic peptide     Status: Abnormal   Collection Time: 04/18/2019  2:18 AM  Result Value Ref Range   B Natriuretic Peptide 475.1 (H) 0.0 - 100.0 pg/mL    Comment: Performed at [facility], [address] [place], [place] [address]  Lactic acid, plasma     Status: Abnormal   Collection Time: 05/01/2019  2:18 AM  Result Value Ref Range   Lactic Acid, Venous 10.6 (HH) 0.5 - 1.9 mmol/L    Comment: CRITICAL RESULT CALLED TO, READ BACK BY AND VERIFIED WITH: Jerame Hedding,R RN 04/15/2019 0320  JORDANS Performed at [facility], [address], [place], [place] [address]   CBC WITH DIFFERENTIAL     Status: Abnormal   Collection Time: 04/22/2019  2:18 AM  Result Value Ref Range   WBC 10.4 4.0 - 10.5 K/uL   RBC 4.18 (L) 4.22 - 5.81 MIL/uL   Hemoglobin 12.9 (L) 13.0 - 17.0 g/dL   HCT 28.4 13.2 - 44.0 %   MCV 100.7 (H) 80.0 - 100.0 fL   MCH 30.9 26.0 - 34.0 pg   MCHC 30.6 30.0 - 36.0 g/dL   RDW 10.2 72.5 - 36.6 %   Platelets 217 150 - 400 K/uL   nRBC 0.7 (H) 0.0 - 0.2 %   Neutrophils  Relative % 63 %   Neutro Abs 6.6 1.7 - 7.7 K/uL   Lymphocytes Relative 25 %   Lymphs Abs 2.6 0.7 - 4.0 K/uL   Monocytes Relative 6 %   Monocytes Absolute 0.6 0.1 - 1.0 K/uL   Eosinophils Relative 1 %   Eosinophils Absolute 0.1 0.0 - 0.5 K/uL   Basophils Relative 1 %   Basophils Absolute 0.1 0.0 - 0.1 K/uL   Immature Granulocytes 4 %   Abs Immature Granulocytes 0.44 (H) 0.00 - 0.07 K/uL    Comment: Performed at [facility], [address], [place], [place] [address]  D-dimer, quantitative     Status: Abnormal   Collection Time: Apr 29, 2019  2:18 AM  Result Value Ref Range   D-Dimer, Quant 5.85 (H) 0.00 - 0.50 ug/mL-FEU    Comment: (NOTE) At the manufacturer cut-off of 0.50 ug/mL FEU, this assay has been documented to exclude PE with a sensitivity and negative predictive value of 97 to 99%.  At this time, this assay has not been approved by the FDA to exclude DVT/VTE. Results should be correlated with clinical presentation. Performed at [facility], [address], [place], [place] [address]   Procalcitonin     Status: None   Collection Time: 29-Apr-2019  2:18 AM  Result Value Ref Range   Procalcitonin <0.10 ng/mL    Comment:        Interpretation: PCT (Procalcitonin) <= 0.5 ng/mL: Systemic infection (sepsis) is not likely. Local bacterial infection is possible. (NOTE)       Sepsis PCT Algorithm           Lower Respiratory Tract                                       Infection PCT Algorithm    ----------------------------     ----------------------------         PCT < 0.25 ng/mL                PCT < 0.10 ng/mL         Strongly encourage             Strongly discourage   discontinuation of antibiotics    initiation of antibiotics    ----------------------------     -----------------------------       PCT 0.25 - 0.50 ng/mL            PCT 0.10 - 0.25 ng/mL               OR       >80% decrease in PCT            Discourage initiation of                                            antibiotics      Encourage discontinuation           of antibiotics    ----------------------------     -----------------------------         PCT >= 0.50 ng/mL              PCT 0.26 - 0.50 ng/mL               AND        <  80% decrease in PCT             Encourage initiation of                                             antibiotics       Encourage continuation           of antibiotics    ----------------------------     -----------------------------        PCT >= 0.50 ng/mL                  PCT > 0.50 ng/mL               AND         increase in PCT                  Strongly encourage                                      initiation of antibiotics    Strongly encourage escalation           of antibiotics                                     -----------------------------                                           PCT <= 0.25 ng/mL                                                 OR                                        > 80% decrease in PCT                                     Discontinue / Do not initiate                                             antibiotics Performed at [facility], [address], [place], [place] [address]   Triglycerides     Status: None   Collection Time: 04/18/2019  2:18 AM  Result Value Ref Range   Triglycerides 123 <150 mg/dL    Comment: Performed at [facility], [address], [place], [place] [address]  Fibrinogen     Status: None    Collection Time: 04/09/2019  2:18 AM  Result Value Ref Range   Fibrinogen 436 210 - 475 mg/dL    Comment: Performed at [facility], [address], [place], [place] [address]  C-reactive protein  Status: Abnormal   Collection Time: 05-08-2019  2:18 AM  Result Value Ref Range   CRP 1.2 (H) <1.0 mg/dL    Comment: Performed at [facility], [address], [place], [place] [address]  Urinalysis, Routine w reflex microscopic     Status: Abnormal   Collection Time: May 08, 2019  2:20 AM  Result Value Ref Range   Color, Urine AMBER (A) YELLOW    Comment: BIOCHEMICALS MAY BE AFFECTED BY COLOR   APPearance TURBID (A) CLEAR   Specific Gravity, Urine 1.014 1.005 - 1.030   pH 6.0 5.0 - 8.0   Glucose, UA >=500 (A) NEGATIVE mg/dL   Hgb urine dipstick SMALL (A) NEGATIVE   Bilirubin Urine NEGATIVE NEGATIVE   Ketones, ur NEGATIVE NEGATIVE mg/dL   Protein, ur >=604 (A) NEGATIVE mg/dL   Nitrite NEGATIVE NEGATIVE   Leukocytes,Ua NEGATIVE NEGATIVE   RBC / HPF 11-20 0 - 5 RBC/hpf   WBC, UA >50 (H) 0 - 5 WBC/hpf   Bacteria, UA MANY (A) NONE SEEN   Squamous Epithelial / LPF 0-5 0 - 5   Sperm, UA PRESENT     Comment: Performed at [facility], [address], [place], [place] [address]  I-STAT 7, (LYTES, BLD GAS, ICA, H+H)     Status: Abnormal   Collection Time: 05-08-19  2:48 AM  Result Value Ref Range   pH, Arterial 7.413 7.350 - 7.450   pCO2 arterial 35.1 32.0 - 48.0 mmHg   pO2, Arterial 396.0 (H) 83.0 - 108.0 mmHg   Bicarbonate 22.4 20.0 - 28.0 mmol/L   TCO2 23 22 - 32 mmol/L   O2 Saturation 100.0 %   Acid-base deficit 2.0 0.0 - 2.0 mmol/L   Sodium 132 (L) 135 - 145 mmol/L   Potassium 3.5 3.5 - 5.1 mmol/L   Calcium, Ion 0.93 (L) 1.15 - 1.40 mmol/L   HCT 37.0 (L) 39.0 - 52.0 %   Hemoglobin 12.6 (L) 13.0 - 17.0 g/dL   Patient temperature HIDE    Sample type ARTERIAL    Dg Chest Portable 1 View  Result Date: 2019-05-08 CLINICAL DATA:  Endotracheal tube placement EXAM: PORTABLE  CHEST 1 VIEW COMPARISON:  04/28/2019 FINDINGS: Due to technical issues, this study was placed under a separate accession number which was previously dictated. The endotracheal tube terminated in the left mainstem bronchus on that study. On this study, the endotracheal tube terminates 2.7 cm above carina. The enteric tube extends below the left hemidiaphragm. The cardiac silhouette is mildly enlarged. Diffuse bilateral hazy airspace opacities are again noted. IMPRESSION: 1. Well-positioned endotracheal tube.  Lines and tubes as above. 2. Multifocal airspace opacities concerning for multifocal pneumonia or aspiration. Electronically Signed   By: Katherine Mantle M.D.   On: 08-May-2019 02:12   Dg Chest Port 1 View  Result Date: 05-08-19 CLINICAL DATA:  Post CPR. EXAM: PORTABLE CHEST 1 VIEW COMPARISON:  None FINDINGS: Initial radiograph demonstrates that the endotracheal tube terminates in the left mainstem bronchus. Subsequent images demonstrate positioning of the endotracheal tube above the carina by approximately 2.7 cm. The enteric tube extends below the left hemidiaphragm. Diffuse bilateral hazy airspace opacities are noted. There is no pneumothorax or large pleural effusion. There is no acute osseous abnormality detected. IMPRESSION: 1. Endotracheal tube terminates 2.7 cm above the carina on the second image provided. The enteric tube extends below the left hemidiaphragm. 2. Diffuse hazy bilateral airspace opacities concerning for multifocal pneumonia or aspiration. Electronically Signed   By:  Katherine Mantle M.D.   On: 04/29/2019 02:03    Pending Labs Unresulted Labs (From admission, onward)    Start     Ordered   04/14/2019 0218  Troponin I - ONCE - STAT  ONCE - STAT,   STAT     04/11/2019 0218   04/20/2019 0218  Lactic acid, plasma  Now then every 2 hours,   STAT     04/29/2019 0218   04/18/2019 0218  Blood Culture (routine x 2)  BLOOD CULTURE X 2,   STAT    Question:  Patient immune status  Answer:   Normal   04/18/2019 0218   04/23/2019 0218  Comprehensive metabolic panel  ONCE - STAT,   STAT     04/09/2019 0218   04/15/2019 0218  Lactate dehydrogenase  Once,   STAT     05/06/2019 0218   04/09/2019 0218  Ferritin  Once,   STAT     05/04/2019 0218          Vitals/Pain Today's Vitals   04/18/2019 0305 04/23/2019 0315 05/08/2019 0330 04/17/2019 0345  BP: 104/77 94/72 115/86 125/84  Pulse: (!) 102 99 97 96  Resp: (!) 24 (!) 24 (!) 24 (!) 24  Temp:      TempSrc:      SpO2: 96% 96% 95% 94%  Weight:      Height:        Isolation Precautions No active isolations  Medications Medications  norepinephrine (LEVOPHED)  in premix infusion (10 mcg/min Intravenous Rate/Dose Change 04/21/2019 0315)  sodium bicarbonate 150 mEq in sterile water 1,000 mL infusion ( Intravenous New Bag/Given 04/09/2019 0202)  vancomycin (VANCOCIN) 1,500 mg in sodium chloride 0.9 % 500 mL IVPB (1,500 mg Intravenous New Bag/Given 04/21/2019 0306)  etomidate (AMIDATE) injection 20 mg (20 mg Intravenous Given 05/03/2019 0120)  rocuronium (ZEMURON) injection 70 mg (70 mg Intravenous Given 04/12/2019 0120)  EPINEPHrine (ADRENALIN) 1 mg (1 mg Intravenous Given 04/15/2019 0129)  sodium bicarbonate injection 50 mEq (50 mEq Intravenous Given 04/26/2019 0129)  sodium chloride 0.9 % bolus 1,000 mL (0 mLs Intravenous Stopped 04/29/2019 0226)  piperacillin-tazobactam (ZOSYN) IVPB 3.375 g (0 g Intravenous Stopped 04/15/2019 0304)    Mobility walks     Focused Assessments FAMILY NOTIFIED ON ADMISSION PLAN AND PLAN OF CARE    R Recommendations: See Admitting Provider Note  Report given to:   Additional Notes:

## 2019-04-10 NOTE — Progress Notes (Signed)
Assisted tele visit to patient with family member.  Hallee Mckenny Ferrer, RN  

## 2019-04-10 NOTE — Progress Notes (Signed)
ANTICOAGULATION CONSULT NOTE   Pharmacy Consult for heparin Indication: chest pain/ACS  Allergies  Allergen Reactions  . Other Other (See Comments)    Yogurt and cold food Causes congestion    Patient Measurements: Height: 5\' 5"  (165.1 cm) Weight: 137 lb 12.6 oz (62.5 kg) IBW/kg (Calculated) : 61.5 Heparin Dosing Weight: 81.6 Vital Signs: Temp: 91.2 F (32.9 C) (06/02 1900) Temp Source: Esophageal (06/02 1900) BP: 111/79 (06/02 2007) Pulse Rate: 67 (06/02 2007)  Labs: Recent Labs    26-Apr-2019 0218  26-Apr-2019 0417  26-Apr-2019 0644 26-Apr-2019 0801  26-Apr-2019 1005 26-Apr-2019 1024 26-Apr-2019 1137 26-Apr-2019 1142 26-Apr-2019 1238 26-Apr-2019 1544 26-Apr-2019 1704 26-Apr-2019 1820  HGB 12.9*   < >  --    < > 13.9  --    < > 12.6*  --   --   --  12.6* 11.6*  --   --   HCT 42.1   < >  --    < > 41.0  --    < > 37.0*  --   --   --  37.0* 34.0*  --   --   PLT 217  --   --   --   --   --   --   --   --   --   --   --   --   --   --   APTT  --   --  31  --   --   --   --   --   --   --  102*  --   --   --   --   LABPROT  --   --  14.2  --   --   --   --   --   --   --  16.1*  --   --   --   --   INR  --   --  1.1  --   --   --   --   --   --   --  1.3*  --   --   --   --   HEPARINUNFRC  --   --   --   --   --   --   --   --   --  0.41  --   --   --   --  0.65  CREATININE 1.64*  --   --   --  1.00  --   --   --  1.13  --   --   --   --  1.01  --   TROPONINI 0.05*  --  0.09*  --   --  0.13*  --   --   --   --   --   --   --  0.15*  --    < > = values in this interval not displayed.    Estimated Creatinine Clearance: 64.3 mL/min (by C-G formula based on SCr of 1.01 mg/dL).   Medical History: History reviewed. No pertinent past medical history.    Assessment: 63 yo man on heparin for ACS.  He will also be on cooling protocol.  No PMH available.   -heparin level confirmed at goal on 650 units/hr  Goal of Therapy:  Heparin level 0.3-0.7 units/ml Monitor platelets by anticoagulation protocol:  Yes   Plan:   Continue IV heparin at current rate. Daily heparin level and CBC  Harland GermanAndrew Amri Lien, PharmD Clinical Pharmacist **Pharmacist  phone directory can now be found on [email or web address] ([email or web address]).  Listed under [facility].

## 2019-04-10 NOTE — ED Notes (Signed)
RT at bedside inserting A-line .

## 2019-04-10 NOTE — H&P (Signed)
Incorrectly labeled as progress note, see below

## 2019-04-10 NOTE — Progress Notes (Signed)
Son in law is here (Ramesh)and chaplain was requested to bring him to consult room outside the ED for Doctor to give a report on his condition.  Son in law said he will remain outside the ED and doctor will keep him posted on condition of father-in-law. Doctor was very kind and spoke to patient's daughter on the hospital phone since the cell phone would not connect properly.   Phebe Colla, Chaplain   04/17/19 0200  Clinical Encounter Type  Visited With Family;Health care provider  Visit Type Initial;ED  Referral From Nurse  Consult/Referral To Chaplain  Spiritual Encounters  Spiritual Needs Emotional  Stress Factors  Family Stress Factors Health changes;Other (Comment) (Fear-he is from [place] and stuck here due to coronovirus)

## 2019-04-10 NOTE — Progress Notes (Signed)
Both ABG values are a reflection of a Rate of 24bpm.

## 2019-04-10 NOTE — Progress Notes (Signed)
Orthopedic Tech Progress Note Patient Details:  Adam Austin [birth year] [id number] Called in order to [facility] for a HARD CERVICAL COLLAR. Patient ID: Adam Austin, male   DOB: 1956-07-26, 63 y.o.   MRN: [id number]   Adam Austin May 09, 2019, 10:07 AM

## 2019-04-10 NOTE — ED Notes (Signed)
Patient transported to CT scan . 

## 2019-04-10 NOTE — Progress Notes (Signed)
Pharmacy Antibiotic Note  Hilmer Litwak is a 63 y.o. male admitted on 05-02-19 with pneumonia.  Pharmacy has been consulted for vancomycin dosing.  Plan: Vancomycin 1500 mg IV q36 hours F/u renal function, cultures and clinical course  Height: 5\' 5"  (165.1 cm) Weight: 180 lb (81.6 kg) IBW/kg (Calculated) : 61.5  Temp (24hrs), Avg:96 F (35.6 C), Min:96 F (35.6 C), Max:96 F (35.6 C)  Recent Labs  Lab 05-02-2019 0154 2019/05/02 0218  WBC  --  10.4  CREATININE 1.40* 1.64*  LATICACIDVEN  --  10.6*    Estimated Creatinine Clearance: 44.7 mL/min (A) (by C-G formula based on SCr of 1.64 mg/dL (H)).    No Known Allergies  Thank you for allowing pharmacy to be a part of this patient's care.  Talbert Cage Poteet 02-May-2019 4:35 AM

## 2019-04-10 NOTE — Progress Notes (Signed)
Patient transported to [facility] w/o complications, uneventful trip. Report given to unit RRT.

## 2019-04-10 NOTE — ED Provider Notes (Addendum)
[facility] EMERGENCY DEPARTMENT Provider Note  CSN: [id number] Arrival date & time: 04/27/2019 0113  Chief Complaint(s) Cardiac Arrest  HPI Adam Austin is a 63 y.o. male with no past medical history other than recurrent sinusitis who presents to the emergency department with cardiac arrest.  Per EMS they were called out for unresponsiveness.  When they arrived patient was in PEA arrest and had agonal breathing.  They immediately began ACLS.  After 2 doses of epinephrine and CPR, return of spontaneous regulation was obtained.  King airway was inserted.  Patient was transported to the emergency department.  In route patient had stable blood pressures.  Several minutes prior to arriving here, EMS reported that the patient bradycardia down quickly.  They began pacing at that time.  Please see below for details regarding care in the emergency department.  I spoke to the patient's daughter and son-in-law who reported that the patient was in his normal state of health up until 2 days ago when he began having a cough.  They report that this occurred in November but quickly resolved after given him Robitussin.  This time his cough did not resolve with Robitussin but patient was given Claritin which the patient reported made him feel better.  Family reports that the patient was last seen around 10:30 PM when he laid down on the recliner to sleep.  Around [phone] [id number], the patient called out to the wife for assistance.  Before she got there, the patient went to the bathroom.  By the time he got back to the chair, the wife was there.  Family reported that the patient collapsed back onto the chair and was hard to arouse.  911 was called at that time and asked to count the patient's breath.  Son-in-law reports that the patient was breathing every 2 to 3 seconds.  EMS arrived 5 to 10 minutes later.  Remainder of history, ROS, and physical exam limited due to patient's condition  (unresponsiveness). Additional information was obtained from EMS and family.   Level V Caveat.   HPI  Past Medical History History reviewed. No pertinent past medical history. There are no active problems to display for this patient.  Home Medication(s) Prior to Admission medications   Not on File                                                                                                                                    Past Surgical History History reviewed. No pertinent surgical history. Family History No family history on file.  Social History Social History   Tobacco Use  . Smoking status: Unknown If Ever Smoked  Substance Use Topics  . Alcohol use: Not on file  . Drug use: Not on file   Allergies Patient has no known allergies.  Review of Systems Review of Systems  Unable to perform ROS: Patient unresponsive    Physical Exam Vital Signs  I have reviewed the triage vital signs BP 115/86   Pulse 97   Temp (!) 96 F (35.6 C) (Temporal)   Resp (!) 24   Ht  (1.651 m)   Wt 81.6 kg   SpO2 95%   BMI 29.95 kg/m   Physical Exam Vitals signs reviewed.  Constitutional:      General: He is in acute distress.     Appearance: He is well-developed.     Comments: King airway in place. Currently being paced.  HENT:     Head: Normocephalic and atraumatic.     Nose: Nose normal.     Mouth/Throat:     Dentition: Abnormal dentition.     Comments: Vomitus on face Eyes:     Conjunctiva/sclera: Conjunctivae normal.     Pupils: Pupils are equal, round, and reactive to light.  Neck:     Musculoskeletal: Normal range of motion and neck supple.     Trachea: No tracheal deviation.  Cardiovascular:     Rate and Rhythm: Normal rate.     Heart sounds: No murmur. No friction rub. No gallop.   Pulmonary:     Breath sounds: Examination of the right-upper field reveals rales. Examination of the left-upper field reveals rales. Examination of the right-middle field  reveals rales. Examination of the left-middle field reveals rales. Examination of the right-lower field reveals rales. Examination of the left-lower field reveals rales. Rales present.     Comments: apneic Abdominal:     General: There is distension.  Musculoskeletal:        General: No tenderness.  Skin:    General: Skin is warm and dry.     Findings: No erythema or rash.  Neurological:     Mental Status: He is unresponsive.     GCS: GCS eye subscore is 1. GCS verbal subscore is 1. GCS motor subscore is 1.     ED Results and Treatments Labs (all labs ordered are listed, but only abnormal results are displayed) Labs Reviewed  LACTIC ACID, PLASMA - Abnormal; Notable for the following components:      Result Value   Lactic Acid, Venous 10.6 (*)    All other components within normal limits  CBC WITH DIFFERENTIAL/PLATELET - Abnormal; Notable for the following components:   RBC 4.18 (*)    Hemoglobin 12.9 (*)    MCV 100.7 (*)    nRBC 0.7 (*)    Abs Immature Granulocytes 0.44 (*)    All other components within normal limits  D-DIMER, QUANTITATIVE (NOT AT [facility]) - Abnormal; Notable for the following components:   D-Dimer, Quant 5.85 (*)    All other components within normal limits  URINALYSIS, ROUTINE W REFLEX MICROSCOPIC - Abnormal; Notable for the following components:   Color, Urine AMBER (*)    APPearance TURBID (*)    Glucose, UA >=500 (*)    Hgb urine dipstick SMALL (*)    Protein, ur >=300 (*)    WBC, UA >50 (*)    Bacteria, UA MANY (*)    All other components within normal limits  CBG MONITORING, ED - Abnormal; Notable for the following components:   Glucose-Capillary 203 (*)    All other components within normal limits  I-STAT CHEM 8, ED - Abnormal; Notable for the following components:   Sodium 127 (*)    Chloride 97 (*)    Creatinine, Ser 1.40 (*)    Glucose, Bld 261 (*)    Calcium, Ion 0.76 (*)  TCO2 20 (*)    All other components within normal limits  POCT  I-STAT 7, (LYTES, BLD GAS, ICA,H+H) - Abnormal; Notable for the following components:   pO2, Arterial 396.0 (*)    Sodium 132 (*)    Calcium, Ion 0.93 (*)    HCT 37.0 (*)    Hemoglobin 12.6 (*)    All other components within normal limits  SARS CORONAVIRUS 2 (HOSPITAL ORDER, PERFORMED IN [facility])  CULTURE, BLOOD (ROUTINE X 2)  CULTURE, BLOOD (ROUTINE X 2)  PROCALCITONIN  TRIGLYCERIDES  FIBRINOGEN  BRAIN NATRIURETIC PEPTIDE  TROPONIN I  LACTIC ACID, PLASMA  COMPREHENSIVE METABOLIC PANEL  LACTATE DEHYDROGENASE  FERRITIN  C-REACTIVE PROTEIN                                                                                                                         EKG  EKG Interpretation  Date/Time:  Tuesday April 19, 2019 01:34:24 EDT Ventricular Rate:  156 PR Interval:    QRS Duration: 86 QT Interval:  318 QTC Calculation: 513 R Axis:   116 Text Interpretation:  Sinus tachycardia Probable left atrial enlargement Right axis deviation Low voltage, precordial leads Borderline T abnormalities, anterior leads Prolonged QT interval No old tracing to compare Confirmed by Drema Pry [phone]) on 04/19/19 2:38:10 AM      Radiology Dg Chest Portable 1 View  Result Date: Apr 19, 2019 CLINICAL DATA:  Endotracheal tube placement EXAM: PORTABLE CHEST 1 VIEW COMPARISON:  04/28/2019 FINDINGS: Due to technical issues, this study was placed under a separate accession number which was previously dictated. The endotracheal tube terminated in the left mainstem bronchus on that study. On this study, the endotracheal tube terminates 2.7 cm above carina. The enteric tube extends below the left hemidiaphragm. The cardiac silhouette is mildly enlarged. Diffuse bilateral hazy airspace opacities are again noted. IMPRESSION: 1. Well-positioned endotracheal tube.  Lines and tubes as above. 2. Multifocal airspace opacities concerning for multifocal pneumonia or aspiration. Electronically Signed   By:  Katherine Mantle M.D.   On: 04-19-2019 02:12   Dg Chest Port 1 View  Result Date: Apr 19, 2019 CLINICAL DATA:  Post CPR. EXAM: PORTABLE CHEST 1 VIEW COMPARISON:  None FINDINGS: Initial radiograph demonstrates that the endotracheal tube terminates in the left mainstem bronchus. Subsequent images demonstrate positioning of the endotracheal tube above the carina by approximately 2.7 cm. The enteric tube extends below the left hemidiaphragm. Diffuse bilateral hazy airspace opacities are noted. There is no pneumothorax or large pleural effusion. There is no acute osseous abnormality detected. IMPRESSION: 1. Endotracheal tube terminates 2.7 cm above the carina on the second image provided. The enteric tube extends below the left hemidiaphragm. 2. Diffuse hazy bilateral airspace opacities concerning for multifocal pneumonia or aspiration. Electronically Signed   By: Katherine Mantle M.D.   On: 19-Apr-2019 02:03   Pertinent labs & imaging results that were available during my care of the patient were reviewed by me and considered in my  medical decision making (see chart for details).  Medications Ordered in ED Medications  norepinephrine (LEVOPHED) 4mg  in 250mL premix infusion (10 mcg/min Intravenous Rate/Dose Change 04/09/2019 0315)  sodium bicarbonate 150 mEq in sterile water 1,000 mL infusion ( Intravenous New Bag/Given 04/15/2019 0202)  vancomycin (VANCOCIN) 1,500 mg in sodium chloride 0.9 % 500 mL IVPB (1,500 mg Intravenous New Bag/Given 05/04/2019 0306)  etomidate (AMIDATE) injection 20 mg (20 mg Intravenous Given 05/08/2019 0120)  rocuronium (ZEMURON) injection 70 mg (70 mg Intravenous Given 04/29/2019 0120)  EPINEPHrine (ADRENALIN) 1 mg (1 mg Intravenous Given 04/25/2019 0129)  sodium bicarbonate injection 50 mEq (50 mEq Intravenous Given 04/19/2019 0129)  sodium chloride 0.9 % bolus 1,000 mL (0 mLs Intravenous Stopped 04/13/2019 0226)  piperacillin-tazobactam (ZOSYN) IVPB 3.375 g (0 g Intravenous Stopped 04/23/2019 0304)                                                                                                                                     Procedures .Critical Care Performed by: Nira Connardama, Cervando Durnin Eduardo, MD Authorized by: Nira Connardama, Theadore Blunck Eduardo, MD   Critical care provider statement:    Critical care time (minutes):  80   Critical care start time:  04/14/2019 1:34 AM   Critical care end time:  04/24/2019 3:54 AM   Critical care time was exclusive of:  Separately billable procedures and treating other patients   Critical care was necessary to treat or prevent imminent or life-threatening deterioration of the following conditions:  Cardiac failure, circulatory failure, respiratory failure, shock and metabolic crisis   Critical care was time spent personally by me on the following activities:  Discussions with consultants, evaluation of patient's response to treatment, examination of patient, ordering and performing treatments and interventions, ordering and review of laboratory studies, ordering and review of radiographic studies, pulse oximetry, re-evaluation of patient's condition, obtaining history from patient or surrogate, review of old charts, development of treatment plan with patient or surrogate, transcutaneous pacing and ventilator management   I assumed direction of critical care for this patient from another provider in my specialty: no   Procedure Name: Intubation Date/Time: 04/27/2019 1:35 AM Performed by: Nira Connardama, Lauralynn Loeb Eduardo, MD Pre-anesthesia Checklist: Suction available, Emergency Drugs available and Patient being monitored Oxygen Delivery Method: Ambu bag Induction Type: Rapid sequence Laryngoscope Size: Glidescope and 4 Tube size: 8.0 mm Number of attempts: 2 Placement Confirmation: ETT inserted through vocal cords under direct vision,  CO2 detector and Breath sounds checked- equal and bilateral Secured at: 23 cm Tube secured with: ETT holder Difficulty Due To: Difficulty was unanticipated  Comments: After intubation, cuff leak was noted - determined to be cuff tear.    Procedure Name: Intubation Date/Time: 05/05/2019 2:17 AM Performed by: Nira Connardama, Margarito Dehaas Eduardo, MD Preoxygenation: Pre-oxygenation with 100% oxygen Laryngoscope Size: Glidescope and 4 Tube size: 7.5 mm Number of attempts: 1 Placement Confirmation: ETT inserted through vocal cords under direct vision,  CO2  detector and Breath sounds checked- equal and bilateral Secured at: 23 cm Tube secured with: ETT holder Difficulty Due To: Difficulty was unanticipated       (including critical care time)  Medical Decision Making / ED Course I have reviewed the nursing notes for this encounter and the patient's prior records (if available in EHR or on provided paperwork).    Cardiac arrest with reported respiratory symptoms by family.  No prior past medical history.  PEA arrest with agonal breathing upon EMS arrival.  Return of spontaneous circulation after 2 rounds of epi.  Patient bradycardia down just prior to arrival to the emergency department and pacing was begun.  Upon arrival, large amount of vomitus noted on the patient's face.  King airway was exchanged for ET tube.  Patient was transitioned to the [place] machine.  Patient noted to be pulseless.  CPR and ACLS was initiated.  Patient was given 1 round of epinephrine and bicarb.  After 1 round return of spontaneous circulation was obtained.  Chest x-ray notable for multifocal opacities concerning for aspiration pneumonia versus multifocal pneumonia.  Patient started on empiric antibiotics.  Patient also noted to have elevated lactic acid likely from cardiac arrest.  Urine with bacteriuria and negative white blood cell counts.  Empiric antibiotics will treat this as well.  Patient required pressors for BP management.  COVID test was negative.  3:33 AM Discussed with CC for admission.  Family updated  Final Clinical Impression(s) / ED Diagnoses Final  diagnoses:  Cardiac arrest (HCC)  Multifocal pneumonia  Bacteriuria  Lactic acidosis  Acute respiratory failure with hypoxia (HCC)      This chart was dictated using voice recognition software.  Despite best efforts to proofread,  errors can occur which can change the documentation meaning.     Nira Conn, MD 04/14/2019 [phone]

## 2019-04-10 NOTE — ED Notes (Signed)
Chaplain coming to speak with son in consultation room

## 2019-04-11 ENCOUNTER — Inpatient Hospital Stay (HOSPITAL_COMMUNITY): Payer: PRIVATE HEALTH INSURANCE

## 2019-04-11 DIAGNOSIS — J9601 Acute respiratory failure with hypoxia: Secondary | ICD-10-CM

## 2019-04-11 DIAGNOSIS — I469 Cardiac arrest, cause unspecified: Principal | ICD-10-CM

## 2019-04-11 LAB — POCT I-STAT 7, (LYTES, BLD GAS, ICA,H+H)
Acid-base deficit: 2 mmol/L (ref 0.0–2.0)
Acid-base deficit: 4 mmol/L — ABNORMAL HIGH (ref 0.0–2.0)
Bicarbonate: 23.2 mmol/L (ref 20.0–28.0)
Bicarbonate: 23.2 mmol/L (ref 20.0–28.0)
Calcium, Ion: 0.88 mmol/L — CL (ref 1.15–1.40)
Calcium, Ion: 0.85 mmol/L — CL (ref 1.15–1.40)
HCT: 36 % — ABNORMAL LOW (ref 39.0–52.0)
HCT: 38 % — ABNORMAL LOW (ref 39.0–52.0)
Hemoglobin: 12.2 g/dL — ABNORMAL LOW (ref 13.0–17.0)
Hemoglobin: 12.9 g/dL — ABNORMAL LOW (ref 13.0–17.0)
O2 Saturation: 97 %
O2 Saturation: 95 %
Patient temperature: 32.9
Patient temperature: 33.1
Potassium: 4.4 mmol/L (ref 3.5–5.1)
Potassium: 5.5 mmol/L — ABNORMAL HIGH (ref 3.5–5.1)
Sodium: 136 mmol/L (ref 135–145)
Sodium: 135 mmol/L (ref 135–145)
TCO2: 24 mmol/L (ref 22–32)
TCO2: 25 mmol/L (ref 22–32)
pCO2 arterial: 35.3 mmHg (ref 32.0–48.0)
pCO2 arterial: 41.3 mmHg (ref 32.0–48.0)
pH, Arterial: 7.407 (ref 7.350–7.450)
pH, Arterial: 7.338 — ABNORMAL LOW (ref 7.350–7.450)
pO2, Arterial: 72 mmHg — ABNORMAL LOW (ref 83.0–108.0)
pO2, Arterial: 68 mmHg — ABNORMAL LOW (ref 83.0–108.0)

## 2019-04-11 LAB — BASIC METABOLIC PANEL
Anion gap: 10 (ref 5–15)
Anion gap: 12 (ref 5–15)
Anion gap: 10 (ref 5–15)
Anion gap: 11 (ref 5–15)
Anion gap: 11 (ref 5–15)
Anion gap: 9 (ref 5–15)
Anion gap: 9 (ref 5–15)
Anion gap: 11 (ref 5–15)
BUN: 12 mg/dL (ref 8–23)
BUN: 12 mg/dL (ref 8–23)
BUN: 13 mg/dL (ref 8–23)
BUN: 13 mg/dL (ref 8–23)
BUN: 14 mg/dL (ref 8–23)
BUN: 13 mg/dL (ref 8–23)
BUN: 12 mg/dL (ref 8–23)
BUN: 13 mg/dL (ref 8–23)
CO2: 21 mmol/L — ABNORMAL LOW (ref 22–32)
CO2: 22 mmol/L (ref 22–32)
CO2: 21 mmol/L — ABNORMAL LOW (ref 22–32)
CO2: 21 mmol/L — ABNORMAL LOW (ref 22–32)
CO2: 21 mmol/L — ABNORMAL LOW (ref 22–32)
CO2: 21 mmol/L — ABNORMAL LOW (ref 22–32)
CO2: 21 mmol/L — ABNORMAL LOW (ref 22–32)
CO2: 20 mmol/L — ABNORMAL LOW (ref 22–32)
Calcium: 6.3 mg/dL — CL (ref 8.9–10.3)
Calcium: 6.1 mg/dL — CL (ref 8.9–10.3)
Calcium: 6.7 mg/dL — ABNORMAL LOW (ref 8.9–10.3)
Calcium: 6.5 mg/dL — ABNORMAL LOW (ref 8.9–10.3)
Calcium: 6.7 mg/dL — ABNORMAL LOW (ref 8.9–10.3)
Calcium: 6.7 mg/dL — ABNORMAL LOW (ref 8.9–10.3)
Calcium: 6.4 mg/dL — CL (ref 8.9–10.3)
Calcium: 6.6 mg/dL — ABNORMAL LOW (ref 8.9–10.3)
Chloride: 102 mmol/L (ref 98–111)
Chloride: 101 mmol/L (ref 98–111)
Chloride: 104 mmol/L (ref 98–111)
Chloride: 103 mmol/L (ref 98–111)
Chloride: 103 mmol/L (ref 98–111)
Chloride: 104 mmol/L (ref 98–111)
Chloride: 105 mmol/L (ref 98–111)
Chloride: 103 mmol/L (ref 98–111)
Creatinine, Ser: 0.95 mg/dL (ref 0.61–1.24)
Creatinine, Ser: 1.15 mg/dL (ref 0.61–1.24)
Creatinine, Ser: 1.15 mg/dL (ref 0.61–1.24)
Creatinine, Ser: 1.23 mg/dL (ref 0.61–1.24)
Creatinine, Ser: 1.17 mg/dL (ref 0.61–1.24)
Creatinine, Ser: 1.11 mg/dL (ref 0.61–1.24)
Creatinine, Ser: 1.26 mg/dL — ABNORMAL HIGH (ref 0.61–1.24)
Creatinine, Ser: 1.29 mg/dL — ABNORMAL HIGH (ref 0.61–1.24)
GFR calc Af Amer: >60 mL/min (ref >60)
GFR calc Af Amer: >60 mL/min (ref >60)
GFR calc Af Amer: >60 mL/min (ref >60)
GFR calc Af Amer: >60 mL/min (ref >60)
GFR calc Af Amer: >60 mL/min (ref >60)
GFR calc Af Amer: >60 mL/min (ref >60)
GFR calc Af Amer: >60 mL/min (ref >60)
GFR calc Af Amer: >60 mL/min (ref >60)
GFR calc non Af Amer: >60 mL/min (ref >60)
GFR calc non Af Amer: >60 mL/min (ref >60)
GFR calc non Af Amer: >60 mL/min (ref >60)
GFR calc non Af Amer: >60 mL/min (ref >60)
GFR calc non Af Amer: >60 mL/min (ref >60)
GFR calc non Af Amer: >60 mL/min (ref >60)
GFR calc non Af Amer: 60 mL/min — ABNORMAL LOW (ref >60)
GFR calc non Af Amer: 58 mL/min — ABNORMAL LOW (ref >60)
Glucose, Bld: 108 mg/dL — ABNORMAL HIGH (ref 70–99)
Glucose, Bld: 129 mg/dL — ABNORMAL HIGH (ref 70–99)
Glucose, Bld: 95 mg/dL (ref 70–99)
Glucose, Bld: 101 mg/dL — ABNORMAL HIGH (ref 70–99)
Glucose, Bld: 98 mg/dL (ref 70–99)
Glucose, Bld: 100 mg/dL — ABNORMAL HIGH (ref 70–99)
Glucose, Bld: 106 mg/dL — ABNORMAL HIGH (ref 70–99)
Glucose, Bld: 131 mg/dL — ABNORMAL HIGH (ref 70–99)
Potassium: 3.4 mmol/L — ABNORMAL LOW (ref 3.5–5.1)
Potassium: 5.5 mmol/L — ABNORMAL HIGH (ref 3.5–5.1)
Potassium: 4.4 mmol/L (ref 3.5–5.1)
Potassium: 3.9 mmol/L (ref 3.5–5.1)
Potassium: 3.8 mmol/L (ref 3.5–5.1)
Potassium: 3.8 mmol/L (ref 3.5–5.1)
Potassium: 3.8 mmol/L (ref 3.5–5.1)
Potassium: 3.8 mmol/L (ref 3.5–5.1)
Sodium: 133 mmol/L — ABNORMAL LOW (ref 135–145)
Sodium: 135 mmol/L (ref 135–145)
Sodium: 135 mmol/L (ref 135–145)
Sodium: 135 mmol/L (ref 135–145)
Sodium: 135 mmol/L (ref 135–145)
Sodium: 134 mmol/L — ABNORMAL LOW (ref 135–145)
Sodium: 135 mmol/L (ref 135–145)
Sodium: 134 mmol/L — ABNORMAL LOW (ref 135–145)

## 2019-04-11 LAB — CBC
HCT: 39.2 % (ref 39.0–52.0)
Hemoglobin: 13 g/dL (ref 13.0–17.0)
MCH: 30.5 pg (ref 26.0–34.0)
MCHC: 33.2 g/dL (ref 30.0–36.0)
MCV: 92 fL (ref 80.0–100.0)
Platelets: 234 10*3/uL (ref 150–400)
RBC: 4.26 MIL/uL (ref 4.22–5.81)
RDW: 13.7 % (ref 11.5–15.5)
WBC: 3.9 10*3/uL — ABNORMAL LOW (ref 4.0–10.5)
nRBC: 0 % (ref 0.0–0.2)

## 2019-04-11 LAB — HEPARIN LEVEL (UNFRACTIONATED)
Heparin Unfractionated: 0.95 IU/mL — ABNORMAL HIGH (ref 0.30–0.70)
Heparin Unfractionated: 0.66 IU/mL (ref 0.30–0.70)
Heparin Unfractionated: 0.52 IU/mL (ref 0.30–0.70)

## 2019-04-11 LAB — HEPATIC FUNCTION PANEL
ALT: 38 U/L (ref 0–44)
AST: 53 U/L — ABNORMAL HIGH (ref 15–41)
Albumin: 2.4 g/dL — ABNORMAL LOW (ref 3.5–5.0)
Alkaline Phosphatase: 46 U/L (ref 38–126)
Bilirubin, Direct: 0.3 mg/dL — ABNORMAL HIGH (ref 0.0–0.2)
Indirect Bilirubin: 1.2 mg/dL — ABNORMAL HIGH (ref 0.3–0.9)
Total Bilirubin: 1.5 mg/dL — ABNORMAL HIGH (ref 0.3–1.2)
Total Protein: 5.1 g/dL — ABNORMAL LOW (ref 6.5–8.1)

## 2019-04-11 LAB — PHOSPHORUS: Phosphorus: 5.9 mg/dL — ABNORMAL HIGH (ref 2.5–4.6)

## 2019-04-11 LAB — GLUCOSE, CAPILLARY
Glucose-Capillary: 130 mg/dL — ABNORMAL HIGH (ref 70–99)
Glucose-Capillary: 138 mg/dL — ABNORMAL HIGH (ref 70–99)
Glucose-Capillary: 123 mg/dL — ABNORMAL HIGH (ref 70–99)
Glucose-Capillary: 82 mg/dL (ref 70–99)

## 2019-04-11 LAB — MAGNESIUM: Magnesium: 1.3 mg/dL — ABNORMAL LOW (ref 1.7–2.4)

## 2019-04-11 MED ORDER — CHLORHEXIDINE GLUCONATE CLOTH 2 % EX PADS
6.0000 | MEDICATED_PAD | Freq: Every day | CUTANEOUS | Status: DC
  Administered 2019-04-12 – 2019-04-15 (×5): 6 via TOPICAL

## 2019-04-11 MED ORDER — SODIUM CHLORIDE 0.9% FLUSH
10.0000 mL | Freq: Two times a day (BID) | INTRAVENOUS | Status: DC
  Administered 2019-04-11 – 2019-04-16 (×12): 10 mL

## 2019-04-11 MED ORDER — CALCIUM GLUCONATE-NACL 1-0.675 GM/50ML-% IV SOLN
1.0000 g | Freq: Once | INTRAVENOUS | Status: AC
  Administered 2019-04-11: 10:00:00 1000 mg via INTRAVENOUS
  Filled 2019-04-11: qty 50

## 2019-04-11 MED ORDER — DEXTROSE 50 % IV SOLN: 100.0000 mL | Freq: Once | INTRAVENOUS | Status: DC

## 2019-04-11 MED ORDER — NOREPINEPHRINE 16 MG/250ML-% IV SOLN
0.0000 ug/min | INTRAVENOUS | Status: DC
  Administered 2019-04-11: 07:00:00 14 ug/min via INTRAVENOUS
  Administered 2019-04-12: 01:00:00 21.013 ug/min via INTRAVENOUS
  Administered 2019-04-12: 18 ug/min via INTRAVENOUS
  Administered 2019-04-13: 13:00:00 10 ug/min via INTRAVENOUS
  Filled 2019-04-11 (×4): qty 250

## 2019-04-11 MED ORDER — VITAL AF 1.2 CAL PO LIQD
1000.0000 mL | ORAL | Status: DC
  Administered 2019-04-11: 18:00:00 1000 mL

## 2019-04-11 MED ORDER — SODIUM CHLORIDE 0.9% FLUSH: 10.0000 mL | INTRAVENOUS | Status: DC | PRN

## 2019-04-11 MED ORDER — CHLORHEXIDINE GLUCONATE CLOTH 2 % EX PADS: 6.0000 | MEDICATED_PAD | Freq: Every day | CUTANEOUS | Status: DC

## 2019-04-11 MED ORDER — MAGNESIUM SULFATE 4 GM/100ML IV SOLN
4.0000 g | Freq: Once | INTRAVENOUS | Status: AC
  Administered 2019-04-11: 4 g via INTRAVENOUS
  Filled 2019-04-11: qty 100

## 2019-04-11 MED ORDER — CALCIUM GLUCONATE-NACL 1-0.675 GM/50ML-% IV SOLN
1.0000 g | Freq: Once | INTRAVENOUS | Status: AC
  Administered 2019-04-11: 22:00:00 1000 mg via INTRAVENOUS
  Filled 2019-04-11: qty 50

## 2019-04-11 MED ORDER — PANTOPRAZOLE SODIUM 40 MG IV SOLR
40.0000 mg | INTRAVENOUS | Status: DC
  Administered 2019-04-11 – 2019-04-16 (×6): 40 mg via INTRAVENOUS
  Filled 2019-04-11 (×7): qty 40

## 2019-04-11 MED ORDER — DEXTROSE 10 % IV SOLN: INTRAVENOUS | Status: DC

## 2019-04-11 NOTE — Progress Notes (Signed)
ANTICOAGULATION CONSULT NOTE   Pharmacy Consult for heparin Indication: chest pain/ACS  Allergies  Allergen Reactions  . Other Other (See Comments)    Yogurt and cold food Causes congestion    Patient Measurements: Height: 5\' 5"  (165.1 cm) Weight: 147 lb 7.8 oz (66.9 kg) IBW/kg (Calculated) : 61.5 Heparin Dosing Weight: 81.6  Vital Signs: Temp: 95.5 F (35.3 C) (06/03 1700) Temp Source: Esophageal (06/03 1700) BP: 81/56 (06/03 1800) Pulse Rate: 73 (06/03 1800)  Labs: Recent Labs    2019/04/11 0218  11-Apr-2019 0417  04/11/2019 0801  04/11/19 1142  2019-04-11 1704  11-Apr-2019 2213  04/11/19 0101 04/11/19 0510 04/11/19 0517 04/11/19 1130 04/11/19 1326 04/11/19 1539 04/11/19 1540  HGB 12.9*   < >  --    < >  --    < >  --    < >  --   --   --    < > 12.2* 13.0 12.9*  --   --   --   --   HCT 42.1   < >  --    < >  --    < >  --    < >  --   --   --    < > 36.0* 39.2 38.0*  --   --   --   --   PLT 217  --   --   --   --   --   --   --   --   --   --   --   --  234  --   --   --   --   --   APTT  --   --  31  --   --   --  102*  --   --   --   --   --   --   --   --   --   --   --   --   LABPROT  --   --  14.2  --   --   --  16.1*  --   --   --   --   --   --   --   --   --   --   --   --   INR  --   --  1.1  --   --   --  1.3*  --   --   --   --   --   --   --   --   --   --   --   --   HEPARINUNFRC  --   --   --   --   --    < >  --   --   --    < >  --   --   --  0.95*  --  0.66  --   --  0.52  CREATININE 1.64*  --   --    < >  --    < >  --   --  1.01  --   --    < >  --  1.15  --  1.15 1.23 1.17  --   TROPONINI 0.05*  --  0.09*  --  0.13*  --   --   --  0.15*  --  0.12*  --   --   --   --   --   --   --   --    < > =  values in this interval not displayed.    Estimated Creatinine Clearance: 55.5 mL/min (by C-G formula based on SCr of 1.17 mg/dL).  Assessment: 63 yo male on heparin for PEA arrest s/p cooling protocol, now rewarming.  Pharmacy has been consulted to dose  heparin.   Heparin this afternoon is therapeutic at 0.52. CBC stable from previous. No bleeding and no issues with heparin infusion noted.   Goal of Therapy:  Heparin level 0.3-0.7 units/ml Monitor platelets by anticoagulation protocol: Yes   Plan:   Continue IV heparin at 550 units/hr Daily heparin level and CBC Monitor for s/sx bleeding   Sheppard CoilFrank Michel Eskelson PharmD., BCPS Clinical Pharmacist 04/11/2019 6:17 PM

## 2019-04-11 NOTE — Progress Notes (Addendum)
NAMEDatwan Austin, MRN:  [id number], DOB:  10-21-1956, LOS: 1 ADMISSION DATE:  04/11/2019, CONSULTATION DATE: 04/21/2019 REFERRING MD:  ER, CHIEF COMPLAINT: Status post cardiac arrest  Brief History   63 year old post witnessed arrest  History of present illness   Patient is an otherwise healthy 63 year old gentleman visiting from [place] who had a witnessed collapse at about 1130 last night.  CPR was started by EMTs after about 10 or 15 minutes.  He was found to be in PEA and ultimately was resuscitated.  He had a King airway placed at the scene.  Unfortunately in route to the hospital he vomited and had probable significant aspiration with vomitus over his face and significant consolidation of both mid lung fields right greater than left.  Spoke with the patient's son-in-law this evening family requests aggressive care.  His son in law tells me that he has never had any significant major medical problems hospitalizations chronic medications that he takes on a regular basis.  Past Medical History  Unremarkable  Significant Hospital Events   6/2 admit for cardiac arrest  Consults:  None  Procedures:  Intubation 6/2 central line 6/2  Significant Diagnostic Tests:  CT head 6/2 > Negative intracranial imaging. No visible infarct or anoxic injury. Lateral atlantodental asymmetry of indeterminate age. If there was trauma, recommend cervical spine CT. Intubation with diffuse sinus fluid levels. EEG 6/2 > This is a severely attenuated electroencephalogram consistent with sedation and hypothermia.   Micro Data:  COVID neg Blood 6/2 > Sputum 6/3 >  Antimicrobials:  Vancomycin 6/2 > Zosyn 6/2 >  Interim history/subjective:  Remains on pressors/vent/TTM sedation. TF started. C-collar in place.   Objective   Blood pressure 112/79, pulse 66, temperature (!) 91.9 F (33.3 C), temperature source Esophageal, resp. rate 10, height 5\' 5"  (1.651 m), weight 66.9 kg, SpO2 98 %. CVP:  [8  mmHg-17 mmHg] 17 mmHg  Vent Mode: PRVC FiO2 (%):  [90 %-100 %] 90 % Set Rate:  [10 bmp-16 bmp] 10 bmp Vt Set:  [400 mL] 400 mL PEEP:  [10 cmH20] 10 cmH20 Plateau Pressure:  [31 cmH20-36 cmH20] 36 cmH20   Intake/Output Summary (Last 24 hours) at 04/11/2019 0932 Last data filed at 04/11/2019 0900 Gross per 24 hour  Intake 5296.86 ml  Output 690 ml  Net 4606.86 ml   Filed Weights   04/21/2019 0114 05/05/2019 0545 04/11/19 0500  Weight: 81.6 kg 62.5 kg 66.9 kg    Examination: General: adult male on vent in NAD HENT: Adam Austin/AT, pupils pinpoint. Scleral edema.  Lungs: Coarse bilaterally. Expiratory wheeze on L.  Cardiovascular:RRR, no MRG Abdomen: Soft, nondistended Extremities: Trace pretibial edema.  Neuro: [facility] Problem list   NA  Assessment & Plan:  Cardiac arrest: PEA arrest with approximately 30 minute downtime. Etiology unclear.  - TTM therapy ongoing. In rewarming phase.  - Troponin trend peaked at 0.15 - Evaluate mental status once rewarmed and determine if further diagnostic testing is warranted.  - CT c-spine to evaluate for injury sustained during arrest/collapse once re-warmed.  Shock: Cardiogenic (PEA arrest) vs septic (aspiration pneumonia) - Telemetry - Levophed for MAP goal > 65 mmHg - Echocardiogram - Ensure lactic acid clearing - Consider albumin as developing widespread edema.   Acute hypoxemic respiratory failure due to aspiration PNA and likely a component of congestive heart failure, possibly acute in nature.  - Full vent support - F/u ABG/CXR - Continue Zosyn and vancomycin - Cultures pending - Echo pending -  Diuresis limited by shock.  - Wean FiO2 PEEP as tolerated.   Prolonged QT interval: - repeat EKG  Hyperglycemia with no history of DM - Sliding scale insulin  Hypocalcemia - calcium gluconate  Best practice:  Diet: N.p.o. Pain/Anxiety/Delirium protocol (if indicated): Fentanyl/Versed VAP protocol (if indicated): Yes  DVT prophylaxis: Yes GI prophylaxis: Pepcid Glucose control: Monitor Mobility: Bedrest Code Status: Full Family Communication: Son updated Disposition: Critically ill/ICU  Labs   CBC: Recent Labs  Lab 12/29/18 0218  12/29/18 1544 12/29/18 2304 04/11/19 0101 04/11/19 0510 04/11/19 0517  WBC 10.4  --   --   --   --  3.9*  --   NEUTROABS 6.6  --   --   --   --   --   --   HGB 12.9*   < > 11.6* 12.2* 12.2* 13.0 12.9*  HCT 42.1   < > 34.0* 36.0* 36.0* 39.2 38.0*  MCV 100.7*  --   --   --   --  92.0  --   PLT 217  --   --   --   --  234  --    < > = values in this interval not displayed.    Basic Metabolic Panel: Recent Labs  Lab 12/29/18 0218  12/29/18 0644  12/29/18 1024  12/29/18 1704 12/29/18 2304 12/29/18 2310 04/11/19 0101 04/11/19 0510 04/11/19 0517  NA 132*   < > 136   < > 135   < > 137 138 133* 136 135 135  K 3.9   < > 2.5*   < > 3.1*   < > 3.2* 3.7 3.4* 4.4 5.5* 5.5*  CL 90*  --  98  --  99  --  104  --  102  --  101  --   CO2 20*  --   --   --  23  --  23  --  21*  --  22  --   GLUCOSE 253*  --  224*  --  150*  --  106*  --  108*  --  129*  --   BUN 13  --  15  --  12  --  12  --  12  --  12  --   CREATININE 1.64*  --  1.00  --  1.13  --  1.01  --  0.95  --  1.15  --   CALCIUM 8.2*  --   --   --  6.8*  --  6.6*  --  6.3*  --  6.1*  --   MG  --   --   --   --   --   --  1.5*  --   --   --  1.3*  --   PHOS  --   --   --   --   --   --  <1.0*  --   --   --  5.9*  --    < > = values in this interval not displayed.   GFR: Estimated Creatinine Clearance: 56.4 mL/min (by C-G formula based on SCr of 1.15 mg/dL). Recent Labs  Lab 12/29/18 0218 12/29/18 0417 04/11/19 0510  PROCALCITON <0.10  --   --   WBC 10.4  --  3.9*  LATICACIDVEN 10.6* 8.2*  --     Liver Function Tests: Recent Labs  Lab 12/29/18 0218 04/11/19 0510  AST 79* 53*  ALT 56* 38  ALKPHOS 64 46  BILITOT 0.8 1.5*  PROT 6.5 5.1*  ALBUMIN 3.4* 2.4*   No results for input(s): LIPASE,  AMYLASE in the last 168 hours. No results for input(s): AMMONIA in the last 168 hours.  ABG    Component Value Date/Time   PHART 7.338 (L) 04/11/2019 0517   PCO2ART 41.3 04/11/2019 0517   PO2ART 68.0 (L) 04/11/2019 0517   HCO3 23.2 04/11/2019 0517   TCO2 25 04/11/2019 0517   ACIDBASEDEF 4.0 (H) 04/11/2019 0517   O2SAT 95.0 04/11/2019 0517     Coagulation Profile: Recent Labs  Lab 05/09/19 0417 05/09/2019 1142  INR 1.1 1.3*    Cardiac Enzymes: Recent Labs  Lab May 09, 2019 0218 May 09, 2019 0417 May 09, 2019 0801 May 09, 2019 1704 05/09/19 2213  TROPONINI 0.05* 0.09* 0.13* 0.15* 0.12*    HbA1C: No results found for: HGBA1C  CBG: Recent Labs  Lab 2019-05-09 2025 05-09-19 2300 04/11/19 0128 04/11/19 0300 04/11/19 0513  GLUCAP 102* 96 130* 138* 123*    Review of Systems:   Unobtainable  Past Medical History  He,  has no past medical history on file.   Surgical History   History reviewed. No pertinent surgical history.   Social History      Family History   His family history is not on file.   Allergies Allergies  Allergen Reactions  . Other Other (See Comments)    Yogurt and cold food Causes congestion     Home Medications  Prior to Admission medications   Not on File     Critical care time: 45 min     Joneen Roach, AGACNP-BC [facility] Pulmonary/Critical Care Pager [phone] or [phone]  04/11/2019 9:37 AM

## 2019-04-11 NOTE — Progress Notes (Signed)
ANTICOAGULATION CONSULT NOTE - Follow Up Consult  Pharmacy Consult for heparin Indication: s/p arrest  Labs: Recent Labs    04/24/2019 0218  04/25/2019 0417  04/22/2019 0801  04/20/2019 1024 05/06/2019 1137 04/19/2019 1142  04/09/2019 1704 04/26/2019 1820 04/29/2019 2213 04/09/2019 2304 04/27/2019 2310 04/11/19 0101 04/11/19 0510 04/11/19 0517  HGB 12.9*   < >  --    < >  --    < >  --   --   --    < >  --   --   --  12.2*  --  12.2*  --  12.9*  HCT 42.1   < >  --    < >  --    < >  --   --   --    < >  --   --   --  36.0*  --  36.0*  --  38.0*  PLT 217  --   --   --   --   --   --   --   --   --   --   --   --   --   --   --   --   --   APTT  --   --  31  --   --   --   --   --  102*  --   --   --   --   --   --   --   --   --   LABPROT  --   --  14.2  --   --   --   --   --  16.1*  --   --   --   --   --   --   --   --   --   INR  --   --  1.1  --   --   --   --   --  1.3*  --   --   --   --   --   --   --   --   --   HEPARINUNFRC  --   --   --   --   --   --   --  0.41  --   --   --  0.65  --   --   --   --  0.95*  --   CREATININE 1.64*  --   --    < >  --   --  1.13  --   --   --  1.01  --   --   --  0.95  --   --   --   TROPONINI 0.05*  --  0.09*  --  0.13*  --   --   --   --   --  0.15*  --  0.12*  --   --   --   --   --    < > = values in this interval not displayed.    Assessment: 63yo male now supratherapeutic on heparin after two levels at goal though had been trending up, likely d/t cooling, will be rewarming starting this am; no gtt issues though has had some oozing per RN.  Goal of Therapy:  Heparin level 0.3-0.7 units/ml   Plan:  Will decrease heparin gtt by ~2 units/kg/hr to 550 units/hr and check level in 6 hours.    Vernard Gambles, PharmD, BCPS  04/11/2019,5:56 AM

## 2019-04-11 NOTE — Progress Notes (Signed)
eLink Physician-Brief Progress Note Patient Name: Ildefonso Stiller DOB: [birth year] MRN: [id number]   Date of Service  04/11/2019  HPI/Events of Note  Hypocalcemia - Calcium = 6.4 which corrects to 7.68 given albumin = 2.4.   eICU Interventions  Will replace Ca++.     Intervention Category Major Interventions: Electrolyte abnormality - evaluation and management  Sommer,Steven Eugene 04/11/2019, 9:18 PM

## 2019-04-11 NOTE — Progress Notes (Addendum)
ANTICOAGULATION CONSULT NOTE   Pharmacy Consult for heparin Indication: chest pain/ACS  Allergies  Allergen Reactions  . Other Other (See Comments)    Yogurt and cold food Causes congestion    Patient Measurements: Height: 5\' 5"  (165.1 cm) Weight: 147 lb 7.8 oz (66.9 kg) IBW/kg (Calculated) : 61.5 Heparin Dosing Weight: 81.6  Vital Signs: Temp: 93.7 F (34.3 C) (06/03 1200) Temp Source: Esophageal (06/03 1200) BP: 96/69 (06/03 1200) Pulse Rate: 63 (06/03 1200)  Labs: Recent Labs    04/11/2019 0218  04/17/2019 0417  05/06/2019 0801  04/25/2019 1142  04/20/2019 1704 04/13/2019 1820 05/04/2019 2213  04/29/2019 2310 04/11/19 0101 04/11/19 0510 04/11/19 0517 04/11/19 1130  HGB 12.9*   < >  --    < >  --    < >  --    < >  --   --   --    < >  --  12.2* 13.0 12.9*  --   HCT 42.1   < >  --    < >  --    < >  --    < >  --   --   --    < >  --  36.0* 39.2 38.0*  --   PLT 217  --   --   --   --   --   --   --   --   --   --   --   --   --  234  --   --   APTT  --   --  31  --   --   --  102*  --   --   --   --   --   --   --   --   --   --   LABPROT  --   --  14.2  --   --   --  16.1*  --   --   --   --   --   --   --   --   --   --   INR  --   --  1.1  --   --   --  1.3*  --   --   --   --   --   --   --   --   --   --   HEPARINUNFRC  --   --   --   --   --    < >  --   --   --  0.65  --   --   --   --  0.95*  --  0.66  CREATININE 1.64*  --   --    < >  --    < >  --   --  1.01  --   --   --  0.95  --  1.15  --   --   TROPONINI 0.05*  --  0.09*  --  0.13*  --   --   --  0.15*  --  0.12*  --   --   --   --   --   --    < > = values in this interval not displayed.    Estimated Creatinine Clearance: 56.4 mL/min (by C-G formula based on SCr of 1.15 mg/dL).  Assessment: 63 yo male on heparin for PEA arrest s/p cooling protocol, now rewarming.  Pharmacy has been consulted to  dose heparin. Heparin this afternoon is therapeutic at 0.66 after dose decrease this morning. CBC stable from  previous. No bleeding and no issues with heparin infusion noted.   Goal of Therapy:  Heparin level 0.3-0.7 units/ml Monitor platelets by anticoagulation protocol: Yes   Plan:   Continue IV heparin at 550 units/hr Check HL in 4 hours (while rewarming) Daily heparin level and CBC Monitor for s/sx bleeding    Harlow MaresAmy Pandora Mccrackin, PharmD PGY1 Pharmacy Resident Phone [phone]  04/11/2019   12:33 PM

## 2019-04-11 NOTE — Progress Notes (Addendum)
CRITICAL VALUE ALERT  Critical Value:  Ca 6.1  Date & Time Notied:  04/11/19 0815  Provider Notified: Joneen Roach   Orders Received/Actions taken: See new orders

## 2019-04-12 ENCOUNTER — Inpatient Hospital Stay (HOSPITAL_COMMUNITY): Payer: PRIVATE HEALTH INSURANCE

## 2019-04-12 DIAGNOSIS — I361 Nonrheumatic tricuspid (valve) insufficiency: Secondary | ICD-10-CM

## 2019-04-12 DIAGNOSIS — J80 Acute respiratory distress syndrome: Secondary | ICD-10-CM

## 2019-04-12 DIAGNOSIS — I371 Nonrheumatic pulmonary valve insufficiency: Secondary | ICD-10-CM

## 2019-04-12 DIAGNOSIS — U071 COVID-19: Secondary | ICD-10-CM

## 2019-04-12 LAB — BASIC METABOLIC PANEL
Anion gap: 12 (ref 5–15)
Anion gap: 9 (ref 5–15)
Anion gap: 10 (ref 5–15)
BUN: 14 mg/dL (ref 8–23)
BUN: 13 mg/dL (ref 8–23)
BUN: 15 mg/dL (ref 8–23)
CO2: 21 mmol/L — ABNORMAL LOW (ref 22–32)
CO2: 21 mmol/L — ABNORMAL LOW (ref 22–32)
CO2: 21 mmol/L — ABNORMAL LOW (ref 22–32)
Calcium: 7.1 mg/dL — ABNORMAL LOW (ref 8.9–10.3)
Calcium: 7 mg/dL — ABNORMAL LOW (ref 8.9–10.3)
Calcium: 7 mg/dL — ABNORMAL LOW (ref 8.9–10.3)
Chloride: 103 mmol/L (ref 98–111)
Chloride: 104 mmol/L (ref 98–111)
Chloride: 104 mmol/L (ref 98–111)
Creatinine, Ser: 1.49 mg/dL — ABNORMAL HIGH (ref 0.61–1.24)
Creatinine, Ser: 1.44 mg/dL — ABNORMAL HIGH (ref 0.61–1.24)
Creatinine, Ser: 1.46 mg/dL — ABNORMAL HIGH (ref 0.61–1.24)
GFR calc Af Amer: 57 mL/min — ABNORMAL LOW (ref >60)
GFR calc Af Amer: 59 mL/min — ABNORMAL LOW (ref >60)
GFR calc Af Amer: 58 mL/min — ABNORMAL LOW (ref >60)
GFR calc non Af Amer: 49 mL/min — ABNORMAL LOW (ref >60)
GFR calc non Af Amer: 51 mL/min — ABNORMAL LOW (ref >60)
GFR calc non Af Amer: 50 mL/min — ABNORMAL LOW (ref >60)
Glucose, Bld: 102 mg/dL — ABNORMAL HIGH (ref 70–99)
Glucose, Bld: 110 mg/dL — ABNORMAL HIGH (ref 70–99)
Glucose, Bld: 125 mg/dL — ABNORMAL HIGH (ref 70–99)
Potassium: 3.9 mmol/L (ref 3.5–5.1)
Potassium: 3.9 mmol/L (ref 3.5–5.1)
Potassium: 3.9 mmol/L (ref 3.5–5.1)
Sodium: 136 mmol/L (ref 135–145)
Sodium: 134 mmol/L — ABNORMAL LOW (ref 135–145)
Sodium: 135 mmol/L (ref 135–145)

## 2019-04-12 LAB — GLUCOSE, CAPILLARY
Glucose-Capillary: 109 mg/dL — ABNORMAL HIGH (ref 70–99)
Glucose-Capillary: 83 mg/dL (ref 70–99)
Glucose-Capillary: 86 mg/dL (ref 70–99)
Glucose-Capillary: 86 mg/dL (ref 70–99)
Glucose-Capillary: 87 mg/dL (ref 70–99)
Glucose-Capillary: 90 mg/dL (ref 70–99)
Glucose-Capillary: 91 mg/dL (ref 70–99)
Glucose-Capillary: 91 mg/dL (ref 70–99)
Glucose-Capillary: 92 mg/dL (ref 70–99)

## 2019-04-12 LAB — HEPARIN LEVEL (UNFRACTIONATED)
Heparin Unfractionated: 0.23 IU/mL — ABNORMAL LOW (ref 0.30–0.70)
Heparin Unfractionated: 0.24 IU/mL — ABNORMAL LOW (ref 0.30–0.70)

## 2019-04-12 LAB — ECHOCARDIOGRAM COMPLETE
Height: 65 in
Weight: 2483.26 oz

## 2019-04-12 LAB — CBC
HCT: 34.3 % — ABNORMAL LOW (ref 39.0–52.0)
Hemoglobin: 11.7 g/dL — ABNORMAL LOW (ref 13.0–17.0)
MCH: 31.5 pg (ref 26.0–34.0)
MCHC: 34.1 g/dL (ref 30.0–36.0)
MCV: 92.5 fL (ref 80.0–100.0)
Platelets: 202 10*3/uL (ref 150–400)
RBC: 3.71 MIL/uL — ABNORMAL LOW (ref 4.22–5.81)
RDW: 14.3 % (ref 11.5–15.5)
WBC: 6.6 10*3/uL (ref 4.0–10.5)
nRBC: 0 % (ref 0.0–0.2)

## 2019-04-12 LAB — PHOSPHORUS: Phosphorus: 4.5 mg/dL (ref 2.5–4.6)

## 2019-04-12 LAB — MAGNESIUM: Magnesium: 2.6 mg/dL — ABNORMAL HIGH (ref 1.7–2.4)

## 2019-04-12 MED ORDER — ACETAMINOPHEN 160 MG/5ML PO SOLN
650.0000 mg | Freq: Four times a day (QID) | ORAL | Status: DC | PRN
  Administered 2019-04-12 – 2019-04-13 (×2): 650 mg
  Filled 2019-04-12 (×2): qty 20.3

## 2019-04-12 MED ORDER — CALCIUM GLUCONATE-NACL 2-0.675 GM/100ML-% IV SOLN
2.0000 g | Freq: Once | INTRAVENOUS | Status: AC
  Administered 2019-04-12: 2000 mg via INTRAVENOUS
  Filled 2019-04-12: qty 100

## 2019-04-12 MED ORDER — ASPIRIN 81 MG PO CHEW
81.0000 mg | CHEWABLE_TABLET | Freq: Every day | ORAL | Status: DC
  Administered 2019-04-12 – 2019-04-16 (×5): 81 mg
  Filled 2019-04-12 (×5): qty 1

## 2019-04-12 MED ORDER — VITAL AF 1.2 CAL PO LIQD
1000.0000 mL | ORAL | Status: DC
  Administered 2019-04-12 – 2019-04-15 (×4): 1000 mL

## 2019-04-12 MED ORDER — MIDAZOLAM HCL 2 MG/2ML IJ SOLN
1.0000 mg | INTRAMUSCULAR | Status: DC | PRN
  Administered 2019-04-13: 02:00:00 2 mg via INTRAVENOUS
  Filled 2019-04-12: qty 2

## 2019-04-12 MED ORDER — FENTANYL CITRATE (PF) 100 MCG/2ML IJ SOLN
25.0000 ug | INTRAMUSCULAR | Status: DC | PRN
  Administered 2019-04-12 – 2019-04-13 (×2): 100 ug via INTRAVENOUS
  Filled 2019-04-12 (×3): qty 2

## 2019-04-12 MED ORDER — FUROSEMIDE 10 MG/ML IJ SOLN
80.0000 mg | Freq: Once | INTRAMUSCULAR | Status: AC
  Administered 2019-04-12: 80 mg via INTRAVENOUS
  Filled 2019-04-12: qty 8

## 2019-04-12 NOTE — Progress Notes (Signed)
100 ml Fentanyl and 35 ml of Versed wasted in sink from IV infusion bags with second RN, Windell Moulding, as witness.

## 2019-04-12 NOTE — Progress Notes (Signed)
  Echocardiogram 2D Echocardiogram has been performed.  Adam Austin G Lynsi Dooner 04/12/2019, 3:16 PM

## 2019-04-12 NOTE — Progress Notes (Signed)
Updated son- Adam Austin in great detail Explained critical nature of his current situation on mechanical ventilation and pressors, that it would take at least another 48 to 72 hours to neuro prognosticate since sedation has to get out of his system Patient's wife would like to visit but explained hospital policy due to COVID  Ulyssa Walthour V. Vassie Loll MD

## 2019-04-12 NOTE — Progress Notes (Signed)
ANTICOAGULATION CONSULT NOTE   Pharmacy Consult for heparin Indication: chest pain/ACS  Allergies  Allergen Reactions  . Other Other (See Comments)    Yogurt and cold food Causes congestion    Patient Measurements: Height: 5\' 5"  (165.1 cm) Weight: 155 lb 3.3 oz (70.4 kg) IBW/kg (Calculated) : 61.5 Heparin Dosing Weight: 81.6  Vital Signs: Temp: 99.1 F (37.3 C) (06/04 1600) Temp Source: Esophageal (06/04 1600) BP: 118/74 (06/04 1700) Pulse Rate: 89 (06/04 1700)  Labs: Recent Labs    04/18/19 0218  2019-04-18 0417  04-18-2019 0801  2019-04-18 1142  04-18-19 1704  Apr 18, 2019 2213  04/11/19 0510 04/11/19 0517  04/11/19 1540  04/12/19 0025 04/12/19 0209 04/12/19 0400 04/12/19 0500 04/12/19 1645  HGB 12.9*   < >  --    < >  --    < >  --    < >  --   --   --    < > 13.0 12.9*  --   --   --   --   --   --  11.7*  --   HCT 42.1   < >  --    < >  --    < >  --    < >  --   --   --    < > 39.2 38.0*  --   --   --   --   --   --  34.3*  --   PLT 217  --   --   --   --   --   --   --   --   --   --   --  234  --   --   --   --   --   --   --  202  --   APTT  --   --  31  --   --   --  102*  --   --   --   --   --   --   --   --   --   --   --   --   --   --   --   LABPROT  --   --  14.2  --   --   --  16.1*  --   --   --   --   --   --   --   --   --   --   --   --   --   --   --   INR  --   --  1.1  --   --   --  1.3*  --   --   --   --   --   --   --   --   --   --   --   --   --   --   --   HEPARINUNFRC  --   --   --   --   --    < >  --   --   --    < >  --   --  0.95*  --    < > 0.52  --   --   --   --  0.23* 0.24*  CREATININE 1.64*  --   --    < >  --    < >  --   --  1.01  --   --    < >  1.15  --    < >  --    < > 1.49* 1.44* 1.46*  --   --   TROPONINI 0.05*  --  0.09*  --  0.13*  --   --   --  0.15*  --  0.12*  --   --   --   --   --   --   --   --   --   --   --    < > = values in this interval not displayed.    Estimated Creatinine Clearance: 44.5 mL/min (A) (by C-G  formula based on SCr of 1.46 mg/dL (H)).  Assessment: 63 y.o. male admitted s/p cardiac arrest for heparin. Heparin level still low this afternoon at 0.24. No bleeding or IV issues noted.   Goal of Therapy:  Heparin level 0.3-0.7 units/ml Monitor platelets by anticoagulation protocol: Yes   Plan:   Increase heparin to 900 units/hr Recheck level in am  Sheppard CoilFrank Kinzlie Harney PharmD., BCPS Clinical Pharmacist 04/12/2019 5:29 PM

## 2019-04-12 NOTE — Progress Notes (Signed)
Video call facilitated with pt's daughter and family with the help of ELink.

## 2019-04-12 NOTE — Progress Notes (Signed)
ANTICOAGULATION CONSULT NOTE   Pharmacy Consult for heparin Indication: chest pain/ACS  Allergies  Allergen Reactions  . Other Other (See Comments)    Yogurt and cold food Causes congestion    Patient Measurements: Height: 5\' 5"  (165.1 cm) Weight: 147 lb 7.8 oz (66.9 kg) IBW/kg (Calculated) : 61.5 Heparin Dosing Weight: 81.6  Vital Signs: Temp: 98.6 F (37 C) (06/04 0500) Temp Source: Oral (06/04 0300) BP: 94/62 (06/04 0328) Pulse Rate: 98 (06/04 0328)  Labs: Recent Labs    04/20/2019 0218  05/02/2019 0417  04/29/2019 0801  05/04/2019 1142  04/13/2019 1704  05/05/2019 2213  04/11/19 0510 04/11/19 0517 04/11/19 1130  04/11/19 1540  04/12/19 0025 04/12/19 0209 04/12/19 0400 04/12/19 0500  HGB 12.9*   < >  --    < >  --    < >  --    < >  --   --   --    < > 13.0 12.9*  --   --   --   --   --   --   --  11.7*  HCT 42.1   < >  --    < >  --    < >  --    < >  --   --   --    < > 39.2 38.0*  --   --   --   --   --   --   --  34.3*  PLT 217  --   --   --   --   --   --   --   --   --   --   --  234  --   --   --   --   --   --   --   --  202  APTT  --   --  31  --   --   --  102*  --   --   --   --   --   --   --   --   --   --   --   --   --   --   --   LABPROT  --   --  14.2  --   --   --  16.1*  --   --   --   --   --   --   --   --   --   --   --   --   --   --   --   INR  --   --  1.1  --   --   --  1.3*  --   --   --   --   --   --   --   --   --   --   --   --   --   --   --   HEPARINUNFRC  --   --   --   --   --    < >  --   --   --    < >  --   --  0.95*  --  0.66  --  0.52  --   --   --   --  0.23*  CREATININE 1.64*  --   --    < >  --    < >  --   --  1.01  --   --    < >  1.15  --  1.15   < >  --    < > 1.49* 1.44* 1.46*  --   TROPONINI 0.05*  --  0.09*  --  0.13*  --   --   --  0.15*  --  0.12*  --   --   --   --   --   --   --   --   --   --   --    < > = values in this interval not displayed.    Estimated Creatinine Clearance: 44.5 mL/min (A) (by C-G formula based on  SCr of 1.46 mg/dL (H)).  Assessment: 63 y.o. male admitted s/p cardiac arrest for heparin  Goal of Therapy:  Heparin level 0.3-0.7 units/ml Monitor platelets by anticoagulation protocol: Yes   Plan:   Increase Heparin  700 units/hr  Geannie Risen, PharmD, BCPS  04/12/2019 5:58 AM

## 2019-04-12 NOTE — Progress Notes (Signed)
PHARMACY - PHYSICIAN COMMUNICATION CRITICAL VALUE ALERT - BLOOD CULTURE IDENTIFICATION (BCID)  Adam Austin is an 63 y.o. male who presented to [facility] on 05/04/2019 with a chief complaint of s/p cardiac arrest  6/2 Blood>>gram positive rods, BCID not done   Name of physician (or Provider) Contacted: Dr. Arsenio Loader   Current antibiotics: Zosyn  Changes to prescribed antibiotics recommended:  No changes  No results found for this or any previous visit.  [name] 04/12/2019  6:40 AM

## 2019-04-12 NOTE — Progress Notes (Signed)
NAMENamish Austin, MRN:  [id number], DOB:  1956-01-01, LOS: 2 ADMISSION DATE:  05/04/2019, CONSULTATION DATE: 04/09/2019 REFERRING MD:  ER, CHIEF COMPLAINT: Status post cardiac arrest  Brief History   63 year old post witnessed arrest  History of present illness   Patient is an otherwise healthy 63 year old gentleman visiting from [place] who had a witnessed collapse at about 1130 last night.  CPR was started by EMTs after about 10 or 15 minutes.  He was found to be in PEA and ultimately was resuscitated.  He had a King airway placed at the scene.  Unfortunately in route to the hospital he vomited and had probable significant aspiration with vomitus over his face and significant consolidation of both mid lung fields right greater than left.  Spoke with the patient's son-in-law this evening family requests aggressive care.  His son in law tells me that he has never had any significant major medical problems hospitalizations chronic medications that he takes on a regular basis.  Past Medical History  Unremarkable  Significant Hospital Events   6/2 admit for cardiac arrest  Consults:  None  Procedures:  Intubation 6/2 central line 6/2  Significant Diagnostic Tests:  CT head 6/2 > Negative intracranial imaging. No visible infarct or anoxic injury. Lateral atlantodental asymmetry of indeterminate age. If there was trauma, recommend cervical spine CT. Intubation with diffuse sinus fluid levels. EEG 6/2 > This is a severely attenuated electroencephalogram consistent with sedation and hypothermia.   Micro Data:  COVID neg Blood 6/2 > GPR aerobic only >>> Sputum 6/3 >  Antimicrobials:  Vancomycin 6/2 > 6/3 Zosyn 6/2 >  Interim history/subjective:  Remains on pressors/vent/TTM sedation. TF started. C-collar in place.   Objective   Blood pressure (!) 86/56, pulse 100, temperature 98.6 F (37 C), temperature source Esophageal, resp. rate 14, height  (1.651 m), weight 70.4 kg,  SpO2 94 %. CVP:  [13 mmHg-17 mmHg] 16 mmHg  Vent Mode: (P) PRVC FiO2 (%):  [60 %] 60 % Set Rate:  [14 bmp] (P) 14 bmp Vt Set:  [400 mL] (P) 400 mL PEEP:  [10 cmH20] (P) 10 cmH20 Plateau Pressure:  [21 cmH20-40 cmH20] (P) 39 cmH20   Intake/Output Summary (Last 24 hours) at 04/12/2019 0918 Last data filed at 04/12/2019 0600 Gross per 24 hour  Intake 2016.37 ml  Output 450 ml  Net 1566.37 ml   Filed Weights   05/02/2019 0545 04/11/19 0500 04/12/19 0500  Weight: 62.5 kg 66.9 kg 70.4 kg    Examination: General: adult male on vent in NAD HENT: [place]/AT, Pupils pinpoint, scleral edema.  Lungs: Coarse bilaterally. Bilateral wheeze Cardiovascular: RRR, no MRG Abdomen: Soft, nondistended Extremities: Trace lower extremity edema.  Neuro: Off sedation 3 hours. Weak cough/gag. (-) corneal, (-) pain response (-) no spontaneous vent breaths on SBT  Resolved Hospital Problem list   NA  Assessment & Plan:  Cardiac arrest: PEA arrest with ~30 minute downtime. Etiology unclear. TTM complete 6/3. Troponin trend peaked at 0.15 - Neuro-prognostication ongoing - Frequent neuro-checks - Give time for sedation to wear of before further imaging  Shock: Cardiogenic (PEA arrest) vs septic (aspiration pneumonia) - Telemetry - Levophed for MAP goal > 65 mmHg (remains on ) - Echocardiogram - Ensure lactic acid clearing - Consider albumin as developing widespread edema.   Sepsis: Aspiration PNA, Gram positive rod bacteremia likely contaminant  - Continue Zosyn, await sensitivities.   Acute hypoxemic respiratory failure due to aspiration PNA and likely a component of congestive  heart failure, possibly acute in nature.  - Full vent support - F/u CXR - Continue Zosyn - Cultures pending - Echo pending - Will try one time dose of Lasix - Wean FiO2 PEEP as tolerated   Prolonged QT interval: - repeat EKG  Hyperglycemia with no history of DM - Sliding scale insulin  Hypocalcemia - calcium  gluconate 2G  Best practice:  Diet:TF start Pain/Anxiety/Delirium protocol (if indicated): Fentanyl/Versed VAP protocol (if indicated): Yes DVT prophylaxis: Yes GI prophylaxis: Pepcid Glucose control: Monitor Mobility: Bedrest Code Status: Full Family Communication: Dr Vassie Loll to contact family.  Disposition: Critically ill/ICU  Labs   CBC: Recent Labs  Lab 04/15/2019 0218  04/27/2019 2304 04/11/19 0101 04/11/19 0510 04/11/19 0517 04/12/19 0500  WBC 10.4  --   --   --  3.9*  --  6.6  NEUTROABS 6.6  --   --   --   --   --   --   HGB 12.9*   < > 12.2* 12.2* 13.0 12.9* 11.7*  HCT 42.1   < > 36.0* 36.0* 39.2 38.0* 34.3*  MCV 100.7*  --   --   --  92.0  --  92.5  PLT 217  --   --   --  234  --  202   < > = values in this interval not displayed.    Basic Metabolic Panel: Recent Labs  Lab 05/04/2019 1704  04/11/19 0510  04/11/19 2009 04/11/19 2204 04/12/19 0025 04/12/19 0209 04/12/19 0400  NA 137   < > 135   < > 135 134* 136 134* 135  K 3.2*   < > 5.5*   < > 3.8 3.8 3.9 3.9 3.9  CL 104   < > 101   < > 105 103 103 104 104  CO2 23   < > 22   < > 21* 20* 21* 21* 21*  GLUCOSE 106*   < > 129*   < > 106* 131* 102* 110* 125*  BUN 12   < > 12   < > 12 13 14 13 15   CREATININE 1.01   < > 1.15   < > 1.26* 1.29* 1.49* 1.44* 1.46*  CALCIUM 6.6*   < > 6.1*   < > 6.4* 6.6* 7.1* 7.0* 7.0*  MG 1.5*  --  1.3*  --   --   --   --   --  2.6*  PHOS <1.0*  --  5.9*  --   --   --   --   --  4.5   < > = values in this interval not displayed.   GFR: Estimated Creatinine Clearance: 44.5 mL/min (A) (by C-G formula based on SCr of 1.46 mg/dL (H)). Recent Labs  Lab 04/22/2019 0218 05/03/2019 0417 04/11/19 0510 04/12/19 0500  PROCALCITON <0.10  --   --   --   WBC 10.4  --  3.9* 6.6  LATICACIDVEN 10.6* 8.2*  --   --     Liver Function Tests: Recent Labs  Lab 04/11/2019 0218 04/11/19 0510  AST 79* 53*  ALT 56* 38  ALKPHOS 64 46  BILITOT 0.8 1.5*  PROT 6.5 5.1*  ALBUMIN 3.4* 2.4*   No results  for input(s): LIPASE, AMYLASE in the last 168 hours. No results for input(s): AMMONIA in the last 168 hours.  ABG    Component Value Date/Time   PHART 7.338 (L) 04/11/2019 0517   PCO2ART 41.3 04/11/2019 0517   PO2ART 68.0 (  L) 04/11/2019 0517   HCO3 23.2 04/11/2019 0517   TCO2 25 04/11/2019 0517   ACIDBASEDEF 4.0 (H) 04/11/2019 0517   O2SAT 95.0 04/11/2019 0517     Coagulation Profile: Recent Labs  Lab 05/01/2019 0417 05/06/2019 1142  INR 1.1 1.3*    Cardiac Enzymes: Recent Labs  Lab 04/26/2019 0218 04/17/2019 0417 04/22/2019 0801 04/18/2019 1704 04/15/2019 2213  TROPONINI 0.05* 0.09* 0.13* 0.15* 0.12*    HbA1C: No results found for: HGBA1C  CBG: Recent Labs  Lab 04/11/19 0513 04/11/19 2026 04/12/19 0026 04/12/19 0405 04/12/19 0744  GLUCAP 123* 82 87 109* 86    Review of Systems:   Unobtainable  Past Medical History  He,  has no past medical history on file.   Surgical History   History reviewed. No pertinent surgical history.   Social History      Family History   His family history is not on file.   Allergies Allergies  Allergen Reactions  . Other Other (See Comments)    Yogurt and cold food Causes congestion     Home Medications  Prior to Admission medications   Not on File     Critical care time: 45 min     Joneen RoachPaul Hoffman, AGACNP-BC [facility]/Critical Care Pager [phone] or [phone]  04/12/2019 9:18 AM

## 2019-04-12 NOTE — Progress Notes (Signed)
RT note: patient does not meet SBT criteria this AM.  Patient's peak pressures also noted to be in the high 40s, plateau pressure measuring at 39, and auto peep of 7 during assessment.  Suctioned patient and was able to obtain a copious amount of thick, tan, secretions however peak pressures still did not change.  Patient noted to also be slightly biting on ETT.  MD made aware of ventilator findings and will assess when rounding on patient.  Will continue to monitor.

## 2019-04-13 ENCOUNTER — Inpatient Hospital Stay (HOSPITAL_COMMUNITY): Payer: PRIVATE HEALTH INSURANCE

## 2019-04-13 ENCOUNTER — Encounter (HOSPITAL_COMMUNITY): Payer: Self-pay | Admitting: Neurology

## 2019-04-13 LAB — BASIC METABOLIC PANEL
Anion gap: 13 (ref 5–15)
BUN: 24 mg/dL — ABNORMAL HIGH (ref 8–23)
CO2: 21 mmol/L — ABNORMAL LOW (ref 22–32)
Calcium: 7.5 mg/dL — ABNORMAL LOW (ref 8.9–10.3)
Chloride: 102 mmol/L (ref 98–111)
Creatinine, Ser: 1.96 mg/dL — ABNORMAL HIGH (ref 0.61–1.24)
GFR calc Af Amer: 41 mL/min — ABNORMAL LOW (ref >60)
GFR calc non Af Amer: 35 mL/min — ABNORMAL LOW (ref >60)
Glucose, Bld: 162 mg/dL — ABNORMAL HIGH (ref 70–99)
Potassium: 3.4 mmol/L — ABNORMAL LOW (ref 3.5–5.1)
Sodium: 136 mmol/L (ref 135–145)

## 2019-04-13 LAB — CULTURE, RESPIRATORY W GRAM STAIN
Culture: NORMAL
Gram Stain: NONE SEEN

## 2019-04-13 LAB — CULTURE, BLOOD (ROUTINE X 2)

## 2019-04-13 LAB — MAGNESIUM: Magnesium: 2.3 mg/dL (ref 1.7–2.4)

## 2019-04-13 LAB — CBC
HCT: 34.5 % — ABNORMAL LOW (ref 39.0–52.0)
Hemoglobin: 11.4 g/dL — ABNORMAL LOW (ref 13.0–17.0)
MCH: 30.8 pg (ref 26.0–34.0)
MCHC: 33 g/dL (ref 30.0–36.0)
MCV: 93.2 fL (ref 80.0–100.0)
Platelets: 199 10*3/uL (ref 150–400)
RBC: 3.7 MIL/uL — ABNORMAL LOW (ref 4.22–5.81)
RDW: 14.5 % (ref 11.5–15.5)
WBC: 6.9 10*3/uL (ref 4.0–10.5)
nRBC: 0 % (ref 0.0–0.2)

## 2019-04-13 LAB — PHOSPHORUS: Phosphorus: 5.2 mg/dL — ABNORMAL HIGH (ref 2.5–4.6)

## 2019-04-13 LAB — PROCALCITONIN: Procalcitonin: 7.75 ng/mL

## 2019-04-13 LAB — GLUCOSE, CAPILLARY
Glucose-Capillary: 180 mg/dL — ABNORMAL HIGH (ref 70–99)
Glucose-Capillary: 95 mg/dL (ref 70–99)
Glucose-Capillary: 136 mg/dL — ABNORMAL HIGH (ref 70–99)
Glucose-Capillary: 162 mg/dL — ABNORMAL HIGH (ref 70–99)
Glucose-Capillary: 129 mg/dL — ABNORMAL HIGH (ref 70–99)
Glucose-Capillary: 113 mg/dL — ABNORMAL HIGH (ref 70–99)

## 2019-04-13 LAB — HEPARIN LEVEL (UNFRACTIONATED)
Heparin Unfractionated: 0.34 IU/mL (ref 0.30–0.70)
Heparin Unfractionated: 0.38 IU/mL (ref 0.30–0.70)

## 2019-04-13 MED ORDER — BUDESONIDE 0.5 MG/2ML IN SUSP
0.5000 mg | Freq: Two times a day (BID) | RESPIRATORY_TRACT | Status: DC
  Administered 2019-04-13 – 2019-04-16 (×6): 0.5 mg via RESPIRATORY_TRACT
  Filled 2019-04-13 (×6): qty 2

## 2019-04-13 MED ORDER — POTASSIUM CHLORIDE 10 MEQ/50ML IV SOLN
10.0000 meq | INTRAVENOUS | Status: AC
  Administered 2019-04-13 (×4): 10 meq via INTRAVENOUS
  Filled 2019-04-13: qty 50

## 2019-04-13 MED ORDER — ALBUTEROL SULFATE (2.5 MG/3ML) 0.083% IN NEBU: 2.5000 mg | INHALATION_SOLUTION | RESPIRATORY_TRACT | Status: DC | PRN

## 2019-04-13 MED ORDER — POTASSIUM CHLORIDE 20 MEQ/15ML (10%) PO SOLN: 40.0000 meq | Freq: Once | ORAL | Status: DC

## 2019-04-13 MED ORDER — IPRATROPIUM-ALBUTEROL 0.5-2.5 (3) MG/3ML IN SOLN
3.0000 mL | Freq: Four times a day (QID) | RESPIRATORY_TRACT | Status: DC
  Administered 2019-04-13 – 2019-04-16 (×14): 3 mL via RESPIRATORY_TRACT
  Filled 2019-04-13 (×15): qty 3

## 2019-04-13 NOTE — Progress Notes (Signed)
Assisted family with video chat via elink.  

## 2019-04-13 NOTE — Procedures (Signed)
ELECTROENCEPHALOGRAM REPORT   Patient: Adam Austin       Room #: [facility] EEG No. ID: [id number] Age: 64 y.o.        Sex: male Referring Physician: Rande Lawman Report Date:  04/13/2019        Interpreting Physician: Thana Farr  History: Adam Austin is an 63 y.o. male s/p arrest  Medications:  ASA, Insulin, Protonix, Zosyn, Levophed, Heparin  Conditions of Recording:  This is a 21 channel routine scalp EEG performed with bipolar and monopolar montages arranged in accordance to the international 10/20 system of electrode placement. One channel was dedicated to EKG recording.  The patient is in the intubated and unresponsive state.  Description:  The background activity is severely attenuated throughout.  This severe attenuation is noted diffusely.  Sensitivity was increased to 3uV/mm from the standard 7uV/mm.  With this increase in sensitivity the background remains severely attenuated.  At times some very, low voltage polymorphic delta could be appreciated at that sensitivity that would be difficult to distinguish from artifact due to its low voltage.   No epileptiform activity is noted.    Hyperventilation and intermittent photic stimulation were not performed.  IMPRESSION: This is a severely attenuated electroencephalogram.  No epileptiform activity is noted.     Thana Farr, MD Neurology [phone] 04/13/2019, 2:38 PM

## 2019-04-13 NOTE — Progress Notes (Signed)
ANTICOAGULATION CONSULT NOTE   Pharmacy Consult for heparin Indication: chest pain/ACS  Allergies  Allergen Reactions  . Other Other (See Comments)    Yogurt and cold food Causes congestion    Patient Measurements: Height: 5\' 5"  (165.1 cm) Weight: 155 lb 3.3 oz (70.4 kg) IBW/kg (Calculated) : 61.5 Heparin Dosing Weight: 81.6  Vital Signs: Temp: 99 F (37.2 C) (06/05 0900) Temp Source: Rectal (06/05 0900) BP: 113/79 (06/05 0900) Pulse Rate: 110 (06/05 0900)  Labs: Recent Labs    29-Aug-2019 1142  29-Aug-2019 1704  29-Aug-2019 2213  04/11/19 0510 04/11/19 0517  04/12/19 0209 04/12/19 0400 04/12/19 0500 04/12/19 1645 04/13/19 0212  HGB  --    < >  --   --   --    < > 13.0 12.9*  --   --   --  11.7*  --  11.4*  HCT  --    < >  --   --   --    < > 39.2 38.0*  --   --   --  34.3*  --  34.5*  PLT  --   --   --   --   --   --  234  --   --   --   --  202  --  199  APTT 102*  --   --   --   --   --   --   --   --   --   --   --   --   --   LABPROT 16.1*  --   --   --   --   --   --   --   --   --   --   --   --   --   INR 1.3*  --   --   --   --   --   --   --   --   --   --   --   --   --   HEPARINUNFRC  --   --   --    < >  --   --  0.95*  --    < >  --   --  0.23* 0.24* 0.34  CREATININE  --   --  1.01  --   --    < > 1.15  --    < > 1.44* 1.46*  --   --  1.96*  TROPONINI  --   --  0.15*  --  0.12*  --   --   --   --   --   --   --   --   --    < > = values in this interval not displayed.    Estimated Creatinine Clearance: 33.1 mL/min (A) (by C-G formula based on SCr of 1.96 mg/dL (H)).  Assessment: 63 y.o. male admitted s/p PEA arrest. Pharmacy has been consulted to dose heparin.   Heparin is now therapeutic at 0.34 after dose increase last night. SCr worsening with CrCl now ~ 2430ml/min. CBC is stable from previous, no issues with the heparin infusion per RN. The patient's RN states there was some bleeding from the patient's tongue earlier but this appears to have resolved. No  other bleeding noted.    Goal of Therapy:  Heparin level 0.3-0.7 units/ml Monitor platelets by anticoagulation protocol: Yes   Plan:   Continue heparin at 900 units/hr Check heparin level in  6 hours Daily HL and CBC while on heparin Monitor for s/sx bleeding.   Harlow Mares, PharmD PGY1 Pharmacy Resident Phone [phone]  04/13/2019   9:55 AM

## 2019-04-13 NOTE — Progress Notes (Signed)
EEG completed, results pending. 

## 2019-04-13 NOTE — Progress Notes (Signed)
Spoke with pt's son Dayna Barker re: rod in pt's right leg.  Dayna Barker stated the rod was placed in the early 1990's in [place].  Dayna Barker is unsure what the rod is made of.  Joneen Roach, NP aware.

## 2019-04-13 NOTE — Progress Notes (Signed)
ANTICOAGULATION CONSULT NOTE   Pharmacy Consult for heparin Indication: chest pain/ACS  Allergies  Allergen Reactions  . Other Other (See Comments)    Yogurt and cold food Causes congestion    Patient Measurements: Height: 5\' 5"  (165.1 cm) Weight: 155 lb 3.3 oz (70.4 kg) IBW/kg (Calculated) : 61.5 Heparin Dosing Weight: 81.6  Vital Signs: Temp: 98.2 F (36.8 C) (06/05 1400) Temp Source: Rectal (06/05 1400) BP: 120/61 (06/05 1511) Pulse Rate: 110 (06/05 1511)  Labs: Recent Labs    04/18/2019 1704  04/12/2019 2213  04/11/19 0510 04/11/19 0517  04/12/19 0209 04/12/19 0400 04/12/19 0500 04/12/19 1645 04/13/19 0212 04/13/19 1410  HGB  --   --   --    < > 13.0 12.9*  --   --   --  11.7*  --  11.4*  --   HCT  --   --   --    < > 39.2 38.0*  --   --   --  34.3*  --  34.5*  --   PLT  --   --   --   --  234  --   --   --   --  202  --  199  --   HEPARINUNFRC  --    < >  --   --  0.95*  --    < >  --   --  0.23* 0.24* 0.34 0.38  CREATININE 1.01  --   --    < > 1.15  --    < > 1.44* 1.46*  --   --  1.96*  --   TROPONINI 0.15*  --  0.12*  --   --   --   --   --   --   --   --   --   --    < > = values in this interval not displayed.    Estimated Creatinine Clearance: 33.1 mL/min (A) (by C-G formula based on SCr of 1.96 mg/dL (H)).  Assessment: 63 y.o. male admitted s/p PEA arrest. Pharmacy has been consulted to dose heparin.   Confirmatory heparin level remains therapeutic at 0.38. No issues with the heparin infusion per RN. No overt bleeding noted.    Goal of Therapy:  Heparin level 0.3-0.7 units/ml Monitor platelets by anticoagulation protocol: Yes   Plan:   Continue heparin at 900 units/hr Daily HL and CBC while on heparin Monitor for s/sx bleeding.  Harlow Mares, PharmD PGY1 Pharmacy Resident Phone [phone]  04/13/2019   3:20 PM

## 2019-04-13 NOTE — Progress Notes (Signed)
NAMETaym Austin, MRN:  [id number], DOB:  Apr 21, 1956, LOS: 3 ADMISSION DATE:  04/27/2019, CONSULTATION DATE: 05/08/2019 REFERRING MD:  ER, CHIEF COMPLAINT: Status post cardiac arrest  Brief History   63 year old post witnessed arrest  History of present illness   Patient is an otherwise healthy 63 year old gentleman visiting from [place] who had a witnessed collapse at about 1130 last night.  CPR was started by EMTs after about 10 or 15 minutes.  He was found to be in PEA and ultimately was resuscitated.  He had a King airway placed at the scene.  Unfortunately in route to the hospital he vomited and had probable significant aspiration with vomitus over his face and significant consolidation of both mid lung fields right greater than left.  Spoke with the patient's son-in-law this evening family requests aggressive care.  His son in law tells me that he has never had any significant major medical problems hospitalizations chronic medications that he takes on a regular basis.  Past Medical History  Unremarkable  Significant Hospital Events   6/2 admit for cardiac arrest  Consults:  None  Procedures:  Intubation 6/2 Central line 6/2 Art line 6/2 >  Significant Diagnostic Tests:  CT head 6/2 > Negative intracranial imaging. No visible infarct or anoxic injury. Lateral atlantodental asymmetry of indeterminate age. If there was trauma, recommend cervical spine CT. Intubation with diffuse sinus fluid levels. EEG 6/2 > This is a severely attenuated electroencephalogram consistent with sedation and hypothermia.   Micro Data:  COVID neg Blood 6/2 > GPR aerobic only >>> Sputum 6/3 >  Antimicrobials:  Vancomycin 6/2 > 6/3 Zosyn 6/2 >  Interim history/subjective:  Some hypoxia overnight r/t biting on ETT. Fixed with sedation. Low water temps./FEvers  Objective   Blood pressure 99/73, pulse 95, temperature 99.5 F (37.5 C), temperature source Rectal, resp. rate 14, height 5\' 5"   (1.651 m), weight 70.4 kg, SpO2 97 %. CVP:  [15 mmHg-20 mmHg] 16 mmHg  Vent Mode: PRVC FiO2 (%):  [50 %-60 %] 50 % Set Rate:  [14 bmp] 14 bmp Vt Set:  [400 mL] 400 mL PEEP:  [5 cmH20-10 cmH20] 5 cmH20 Plateau Pressure:  [31 cmH20-42 cmH20] 39 cmH20   Intake/Output Summary (Last 24 hours) at 04/13/2019 0854 Last data filed at 04/13/2019 0600 Gross per 24 hour  Intake 2226.58 ml  Output 2840 ml  Net -613.42 ml   Filed Weights   04/23/2019 0545 04/11/19 0500 04/12/19 0500  Weight: 62.5 kg 66.9 kg 70.4 kg    Examination: General: adult male on vent.  HENT: [place]/AT, Pupils pinpoint, scleral edema.  Lungs: Coarse bilaterally. Bilateral expiratory wheeze Cardiovascular: RRR, no MRG Abdomen: Soft, nondistended Extremities: Trace lower extremity edema.  Neuro: Off sedation 24 hours. Cough/gag weak. No response to pain.   Resolved Hospital Problem list   NA  Assessment & Plan:  Cardiac arrest: PEA arrest with ~30 minute downtime. Etiology unclear. TTM complete 6/3. Troponin trend peaked at 0.15 - Neuro-prognostication ongoing - Frequent neuro-checks - MRI brain/C-spine - Repeat EEG  Shock: Neurogenic vs septic (aspiration pneumonia). Echo demonstrates vigorous LV function.  - Telemetry - Levophed for MAP goal > 65 mmHg ( this AM) - Check CVP (14) - Consider albumin as developing widespread edema.   Sepsis: Aspiration PNA, Gram positive rod bacteremia likely contaminant  - Continue Zosyn, await sensitivities.  - Fevers overnight, but temps have been all over. Changing measurement technique then will readdress. May need to escalate ABX. PCT < .10  on admit. Will repeat.   Acute hypoxemic respiratory failure due to aspiration PNA and likely a component of congestive heart failure, possibly acute in nature. Wheeze on exam. COPD? - Full vent support - F/u CXR - ABX above - Did have some diuresis with lasix  yesterday, but renal function responded poorly. Hold diuresis.  - Wean  FiO2 PEEP as tolerated  - Bronchodilators  Prolonged QT interval: improved 6/4 (440) - repeat EKG  Hyperglycemia with no history of DM - Sliding scale insulin  Hypocalcemia - BMP in am  Best practice:  Diet:TF Pain/Anxiety/Delirium protocol (if indicated): Fentanyl/Versed PRN VAP protocol (if indicated): Yes DVT prophylaxis: Yes GI prophylaxis: Pepcid Glucose control: Monitor Mobility: Bedrest Code Status: Full Family Communication: pending Disposition: Critically ill/ICU  Labs   CBC: Recent Labs  Lab 22-Apr-2019 0218  04/11/19 0101 04/11/19 0510 04/11/19 0517 04/12/19 0500 04/13/19 0212  WBC 10.4  --   --  3.9*  --  6.6 6.9  NEUTROABS 6.6  --   --   --   --   --   --   HGB 12.9*   < > 12.2* 13.0 12.9* 11.7* 11.4*  HCT 42.1   < > 36.0* 39.2 38.0* 34.3* 34.5*  MCV 100.7*  --   --  92.0  --  92.5 93.2  PLT 217  --   --  234  --  202 199   < > = values in this interval not displayed.    Basic Metabolic Panel: Recent Labs  Lab 04/22/2019 1704  04/11/19 0510  04/11/19 2204 04/12/19 0025 04/12/19 0209 04/12/19 0400 04/13/19 0212  NA 137   < > 135   < > 134* 136 134* 135 136  K 3.2*   < > 5.5*   < > 3.8 3.9 3.9 3.9 3.4*  CL 104   < > 101   < > 103 103 104 104 102  CO2 23   < > 22   < > 20* 21* 21* 21* 21*  GLUCOSE 106*   < > 129*   < > 131* 102* 110* 125* 162*  BUN 12   < > 12   < > 24*  CREATININE 1.01   < > 1.15   < > 1.29* 1.49* 1.44* 1.46* 1.96*  CALCIUM 6.6*   < > 6.1*   < > 6.6* 7.1* 7.0* 7.0* 7.5*  MG 1.5*  --  1.3*  --   --   --   --  2.6* 2.3  PHOS <1.0*  --  5.9*  --   --   --   --  4.5 5.2*   < > = values in this interval not displayed.   GFR: Estimated Creatinine Clearance: 33.1 mL/min (A) (by C-G formula based on SCr of 1.96 mg/dL (H)). Recent Labs  Lab 22-Apr-2019 0218 22-Apr-2019 0417 04/11/19 0510 04/12/19 0500 04/13/19 0212  PROCALCITON <0.10  --   --   --   --   WBC 10.4  --  3.9* 6.6 6.9  LATICACIDVEN 10.6* 8.2*  --   --   --      Liver Function Tests: Recent Labs  Lab 04/22/2019 0218 04/11/19 0510  AST 79* 53*  ALT 56* 38  ALKPHOS 64 46  BILITOT 0.8 1.5*  PROT 6.5 5.1*  ALBUMIN 3.4* 2.4*   No results for input(s): LIPASE, AMYLASE in the last 168 hours. No results for input(s): AMMONIA in the last 168 hours.  ABG    Component Value Date/Time   PHART 7.338 (L) 04/11/2019 0517   PCO2ART 41.3 04/11/2019 0517   PO2ART 68.0 (L) 04/11/2019 0517   HCO3 23.2 04/11/2019 0517   TCO2 25 04/11/2019 0517   ACIDBASEDEF 4.0 (H) 04/11/2019 0517   O2SAT 95.0 04/11/2019 0517     Coagulation Profile: Recent Labs  Lab 02-14-2019 0417 02-14-2019 1142  INR 1.1 1.3*    Cardiac Enzymes: Recent Labs  Lab 02-14-2019 0218 02-14-2019 0417 02-14-2019 0801 02-14-2019 1704 02-14-2019 2213  TROPONINI 0.05* 0.09* 0.13* 0.15* 0.12*    HbA1C: No results found for: HGBA1C  CBG: Recent Labs  Lab 04/12/19 0744 04/12/19 1131 04/12/19 1548 04/12/19 2008 04/13/19 0817  GLUCAP 86 90 92 86 136*     Critical care time: 45 min     Joneen RoachPaul Alaia Lordi, AGACNP-BC [facility] Pulmonary/Critical Care Pager [phone] or [phone]  04/13/2019 8:54 AM

## 2019-04-13 NOTE — Progress Notes (Signed)
Assisted tele visit to patient with family member.  Ayana Imhof Anderson, RN   

## 2019-04-13 NOTE — Consult Note (Addendum)
Neurology Consultation  Reason for Consult: Prognostication Referring Physician: Mariane Masters   History is obtained from: Chart  HPI: Adam Austin is a 62 y.o. male with no past medical history known other than recurrent sinus infections.  Patient had a cardiac arrest.  Per EMS they were called out for unresponsiveness.  When they arrived patient was in PEA arrest and had agonal breathing.  They immediately began ACLS.  After 2 doses of epinephrine and CPR patient had return of spontaneous circulation.  Family reports that the patient was last seen around 10:30 PM that day when he lay down in the recliner to sleep.  Around [id number] the patient called out to the wife for assistance.  Before she got there the patient was to the bathroom patient then went back to the chair.  Family reported that the patient collapsed into the chair and was hard to arouse.  Per son-in-law the patient was only breathing approximately 2 to 3 seconds and EMS arrived 5 to 10 minutes later.  Downtime is unclear however it is known that EMS took 10 to 15 minutes to get the patient at this point downtime was question to be about 30 minutes..  Initial CT of head on 6/2 was negative.  EEG on 6/2 showed severely attenuated lecture encephalogram consistent of sedation and hypothermia.  Patient was diagnosed also with aspiration pneumonia with gram-positive rod bacteremia secondary to aspiration.  On 6/2 2020 at 5:45 AM patient was at hypothermia.  He id currently be rewarmed. Nimbex has been discontinued on 6/3.  Fentanyl, Versed, was DC'd at 8:15 AM yesterday .  Since then he has been intermittently on fentanyl for biting on the tube.  ROS: A 14 point ROS was performed and is negative except as noted in the HPI.   History reviewed. No pertinent past medical history.   Family History  Problem Relation Age of Onset  . Hypertension Mother   . Hypertension Father     Social History:   has no history on file for tobacco,  alcohol, and drug.  Medications  Current Facility-Administered Medications:  .  0.9 %  sodium chloride infusion, , Intravenous, PRN, Shellia Cleverly, MD, Stopped at 04/11/19 1124 .  acetaminophen (TYLENOL) solution 650 mg, 650 mg, Per Tube, Q6H PRN, Rigoberto Noel, MD, 650 mg at 04/13/19 0058 .  albuterol (PROVENTIL) (2.5 MG/3ML) 0.083% nebulizer solution 2.5 mg, 2.5 mg, Nebulization, Q3H PRN, Corey Harold, NP .  artificial tears (LACRILUBE) ophthalmic ointment 1 application, 1 application, Both Eyes, Q8H, Shellia Cleverly, MD, 1 application at 09/32/67 [phone] .  aspirin chewable tablet 81 mg, 81 mg, Per Tube, Daily, Corey Harold, NP, 81 mg at 04/13/19 0941 .  budesonide (PULMICORT) nebulizer solution 0.5 mg, 0.5 mg, Nebulization, BID, Corey Harold, NP .  chlorhexidine gluconate (MEDLINE KIT) (PERIDEX) 0.12 % solution 15 mL, 15 mL, Mouth Rinse, BID, Shellia Cleverly, MD, 15 mL at 04/13/19 0845 .  Chlorhexidine Gluconate Cloth 2 % PADS 6 each, 6 each, Topical, Daily, Rigoberto Noel, MD, 6 each at 04/13/19 [phone] .  feeding supplement (VITAL AF 1.2 CAL) liquid 1,000 mL, 1,000 mL, Per Tube, Continuous, Shellia Cleverly, MD, Last Rate: 55 mL/hr at 04/12/19 1040, 1,000 mL at 04/12/19 1040 .  fentaNYL (SUBLIMAZE) injection 25-100 mcg, 25-100 mcg, Intravenous, Q2H PRN, Deterding, Guadelupe Sabin, MD, 100 mcg at 04/13/19 0058 .  heparin ADULT infusion 100 units/mL (25000 units/280m sodium chloride 0.45%), 900 Units/hr, Intravenous, Continuous, WErin Hearing  R, RPH, Last Rate: 9 mL/hr at 04/13/19 0900, 900 Units/hr at 04/13/19 0900 .  insulin aspart (novoLOG) injection 0-15 Units, 0-15 Units, Subcutaneous, Q4H, Jennelle Human B, NP, 2 Units at 04/13/19 0844 .  ipratropium-albuterol (DUONEB) 0.5-2.5 (3) MG/3ML nebulizer solution 3 mL, 3 mL, Nebulization, Q6H, Corey Harold, NP, 3 mL at 04/13/19 0927 .  MEDLINE mouth rinse, 15 mL, Mouth Rinse, 10 times per day, Shellia Cleverly, MD, 15 mL at 04/13/19  1000 .  midazolam (VERSED) injection 1-2 mg, 1-2 mg, Intravenous, Q2H PRN, Deterding, Guadelupe Sabin, MD, 2 mg at 04/13/19 0212 .  norepinephrine (LEVOPHED) 16 mg in 250m premix infusion, 0-50 mcg/min, Intravenous, Titrated, EShellia Cleverly MD, Last Rate: 9.38 mL/hr at 04/13/19 1232, 10 mcg/min at 04/13/19 1232 .  pantoprazole (PROTONIX) injection 40 mg, 40 mg, Intravenous, Q24H, HCorey Harold NP, 40 mg at 04/12/19 1245 .  piperacillin-tazobactam (ZOSYN) IVPB 3.375 g, 3.375 g, Intravenous, Q8H, EShellia Cleverly MD, Last Rate: 12.5 mL/hr at 04/13/19 1122, 3.375 g at 04/13/19 1122 .  sodium chloride flush (NS) 0.9 % injection 10-40 mL, 10-40 mL, Intracatheter, Q12H, EShellia Cleverly MD, 10 mL at 04/13/19 0949 .  sodium chloride flush (NS) 0.9 % injection 10-40 mL, 10-40 mL, Intracatheter, PRN, EShellia Cleverly MD   Exam: Current vital signs: BP 135/72   Pulse (!) 103   Temp 99 F (37.2 C) (Rectal)   Resp 14   Ht '5\' 5"'$  (1.651 m)   Wt 70.4 kg   SpO2 94%   BMI 25.83 kg/m  Vital signs in last 24 hours: Temp:  [95.7 F (35.4 C)-100.9 F (38.3 C)] 99 F (37.2 C) (06/05 0900) Pulse Rate:  [77-113] 103 (06/05 1107) Resp:  [13-17] 14 (06/05 1107) BP: (85-135)/(59-85) 135/72 (06/05 1107) SpO2:  [88 %-98 %] 94 % (06/05 1107) Arterial Line BP: (81-140)/(55-76) 140/72 (06/05 1100) FiO2 (%):  [50 %] 50 % (06/05 1107)  Physical Exam  Constitutional: Appears well-developed and well-nourished.  Psych: Intubated Eyes: Positive scleral injection HENT: No OP obstrucion Head: Normocephalic.  Cardiovascular: Normal rate and regular rhythm.  Respiratory: Effort normal, non-labored breathing GI: Soft.  No distension. There is no tenderness.  Skin: WDI  Neuro: Mental Status: Patient does not respond to verbal stimuli.  Does not respond to deep sternal rub.  Does not follow commands.  No verbalizations noted as he is intubated at this time.  He is not breathing over the vent. Cranial  Nerves: II: patient does not respond confrontation bilaterally,  III,IV,VI: doll's response absent bilaterally. pupils right 2 mm, left 2 mm,and grossly reactive bilaterally V,VII: corneal reflex present on the right  VIII: patient does not respond to verbal stimuli IX,X: gag reflex absent, XI: trapezius strength unable to test bilaterally XII: tongue strength unable to test Motor: Extremities flaccid throughout.  No spontaneous movement noted.  No purposeful movements noted. Sensory: Does not respond to noxious stimuli in any extremity. Deep Tendon Reflexes:  Absent throughout. Plantars: absent bilaterally Cerebellar: Unable to perform  Labs I have reviewed labs in epic and the results pertinent to this consultation are:   CBC    Component Value Date/Time   WBC 6.9 04/13/2019 0212   RBC 3.70 (L) 04/13/2019 0212   HGB 11.4 (L) 04/13/2019 0212   HCT 34.5 (L) 04/13/2019 0212   PLT 199 04/13/2019 0212   MCV 93.2 04/13/2019 0212   MCH 30.8 04/13/2019 0212   MCHC 33.0 04/13/2019 0212   RDW 14.5  04/13/2019 0212   LYMPHSABS 2.6 05/06/2019 0218   MONOABS 0.6 05/07/2019 0218   EOSABS 0.1 05/07/2019 0218   BASOSABS 0.1 04/20/2019 0218    CMP     Component Value Date/Time   NA 136 04/13/2019 0212   K 3.4 (L) 04/13/2019 0212   CL 102 04/13/2019 0212   CO2 21 (L) 04/13/2019 0212   GLUCOSE 162 (H) 04/13/2019 0212   BUN 24 (H) 04/13/2019 0212   CREATININE 1.96 (H) 04/13/2019 0212   CALCIUM 7.5 (L) 04/13/2019 0212   PROT 5.1 (L) 04/11/2019 0510   ALBUMIN 2.4 (L) 04/11/2019 0510   AST 53 (H) 04/11/2019 0510   ALT 38 04/11/2019 0510   ALKPHOS 46 04/11/2019 0510   BILITOT 1.5 (H) 04/11/2019 0510   GFRNONAA 35 (L) 04/13/2019 0212   GFRAA 41 (L) 04/13/2019 0212    Lipid Panel     Component Value Date/Time   TRIG 123 04/19/2019 0218     Imaging I have reviewed the images obtained:  CT-scan of the brain-negative intracranial imaging.  No visible infarct or anoxic  injury.  I did show a lateral atlantodental asymmetry of indeterminate age  MRI examination of the brain-pending  Etta Quill PA-C [facility] [phone]  M-F  (9:00 am- 5:00 PM)  04/13/2019, 12:58 PM     Assessment: 63 year old male with cardiac arrest lasting approximately 30 minutes.  Patient will be fully rewarmed at approximately 22 hours on 04/13/2019.  Currently patient has been off of all sedation since yesterday.  Current exam only shows positive pupillary reflex and positive corneal on the right.    Impression: -Cardiac arrest PEA -Aspiration pneumonia -Sepsis - Hyperglycemia currently on sliding scale  Recommendations: - At this time prognostication is guarded as patient has been cooled.  We will need to wait 72 hours before making final prognostication. - At 72 hours will need to obtain repeat EEG, - MRI brain in 48 hours -We will follow with you   NEUROHOSPITALIST ADDENDUM Performed a face to face diagnostic evaluation.   I have reviewed the contents of history and physical exam as documented by PA/ARNP/Resident and agree with above documentation.  I have discussed and formulated the above plan as documented. Edits to the note have been made as needed.  PEA arrest status post resuscitation and currently being rewarmed after hypothermia.  Current temperature was 36.5.  He is between 48 to 72 hours.  On examination pupils are reactive.  Doll's and corneal is absent. Did not assess gag, per nurse has been absent.  Extremities are flaccid and plantars are mute.  I am concerned of a poor prognosis, however patient needs to be normothermic at time of assessment.  Also recommend MRI brain to assist with prognostication.  Review EEG, no blood per form discharges. Severely attenuated EEG.   Karena Addison Aroor MD [facility] [phone]   If 7pm to 7am, please call on call as listed on AMION.

## 2019-04-13 NOTE — Progress Notes (Signed)
CDS was called on 04/13/2019 at 1600h. Spoke with Curlene Labrum. Reference number: [id number].   Joycie Peek, RN 04/13/2019, 1600h

## 2019-04-14 ENCOUNTER — Inpatient Hospital Stay (HOSPITAL_COMMUNITY): Payer: PRIVATE HEALTH INSURANCE

## 2019-04-14 DIAGNOSIS — J189 Pneumonia, unspecified organism: Secondary | ICD-10-CM

## 2019-04-14 LAB — GLUCOSE, CAPILLARY
Glucose-Capillary: 105 mg/dL — ABNORMAL HIGH (ref 70–99)
Glucose-Capillary: 124 mg/dL — ABNORMAL HIGH (ref 70–99)
Glucose-Capillary: 128 mg/dL — ABNORMAL HIGH (ref 70–99)
Glucose-Capillary: 143 mg/dL — ABNORMAL HIGH (ref 70–99)
Glucose-Capillary: 144 mg/dL — ABNORMAL HIGH (ref 70–99)
Glucose-Capillary: 154 mg/dL — ABNORMAL HIGH (ref 70–99)

## 2019-04-14 LAB — PROCALCITONIN: Procalcitonin: 5.62 ng/mL

## 2019-04-14 LAB — BASIC METABOLIC PANEL
Anion gap: 11 (ref 5–15)
BUN: 31 mg/dL — ABNORMAL HIGH (ref 8–23)
CO2: 24 mmol/L (ref 22–32)
Calcium: 7.6 mg/dL — ABNORMAL LOW (ref 8.9–10.3)
Chloride: 101 mmol/L (ref 98–111)
Creatinine, Ser: 2.08 mg/dL — ABNORMAL HIGH (ref 0.61–1.24)
GFR calc Af Amer: 38 mL/min — ABNORMAL LOW (ref >60)
GFR calc non Af Amer: 33 mL/min — ABNORMAL LOW (ref >60)
Glucose, Bld: 144 mg/dL — ABNORMAL HIGH (ref 70–99)
Potassium: 3.8 mmol/L (ref 3.5–5.1)
Sodium: 136 mmol/L (ref 135–145)

## 2019-04-14 LAB — CBC
HCT: 29.1 % — ABNORMAL LOW (ref 39.0–52.0)
Hemoglobin: 9.4 g/dL — ABNORMAL LOW (ref 13.0–17.0)
MCH: 30.4 pg (ref 26.0–34.0)
MCHC: 32.3 g/dL (ref 30.0–36.0)
MCV: 94.2 fL (ref 80.0–100.0)
Platelets: 155 10*3/uL (ref 150–400)
RBC: 3.09 MIL/uL — ABNORMAL LOW (ref 4.22–5.81)
RDW: 14.5 % (ref 11.5–15.5)
WBC: 5.4 10*3/uL (ref 4.0–10.5)
nRBC: 0 % (ref 0.0–0.2)

## 2019-04-14 LAB — MAGNESIUM: Magnesium: 2.2 mg/dL (ref 1.7–2.4)

## 2019-04-14 LAB — PHOSPHORUS: Phosphorus: 4.1 mg/dL (ref 2.5–4.6)

## 2019-04-14 LAB — HEPARIN LEVEL (UNFRACTIONATED): Heparin Unfractionated: 0.45 IU/mL (ref 0.30–0.70)

## 2019-04-14 MED ORDER — ENOXAPARIN SODIUM 40 MG/0.4ML ~~LOC~~ SOLN
40.0000 mg | SUBCUTANEOUS | Status: DC
  Administered 2019-04-14 – 2019-04-16 (×3): 40 mg via SUBCUTANEOUS
  Filled 2019-04-14 (×3): qty 0.4

## 2019-04-14 MED ORDER — POTASSIUM CHLORIDE 20 MEQ PO PACK
40.0000 meq | PACK | Freq: Once | ORAL | Status: AC
  Administered 2019-04-14: 40 meq via ORAL
  Filled 2019-04-14: qty 2

## 2019-04-14 NOTE — Plan of Care (Signed)
  Problem: Cardiac: Goal: Ability to achieve and maintain adequate cardiopulmonary perfusion will improve Outcome: Progressing   Problem: Skin Integrity: Goal: Risk for impaired skin integrity will be minimized. Outcome: Progressing   Problem: Respiratory: Goal: Ability to maintain a clear airway and adequate ventilation will improve Outcome: Progressing   Problem: Neurologic: Goal: Promote progressive neurologic recovery Outcome: Not Progressing Patient remains unresponsive.

## 2019-04-14 NOTE — Progress Notes (Signed)
CRITICAL VALUE ALERT  Critical Value:  CVP 21,  Provider Notified: Dr. Jimmy Footman, 04/14/2019, 0423  Orders Received/Actions taken: No orders received at this time.

## 2019-04-14 NOTE — Progress Notes (Addendum)
Reason for consult:   Subjective:   WJX:BJYNWGROS:Unable to obtain due to poor mental status  Examination  Vital signs in last 24 hours: Temp:  [96.5 F (35.8 C)-99.9 F (37.7 C)] 96.5 F (35.8 C) (06/06 1130) Pulse Rate:  [54-120] 118 (06/06 0700) Resp:  [14] 14 (06/06 0700) BP: (84-120)/(47-72) 89/65 (06/06 0700) SpO2:  [84 %-100 %] 97 % (06/06 1130) Arterial Line BP: (103-140)/(54-78) 129/65 (06/06 0700) FiO2 (%):  [50 %] 50 % (06/06 1123) Weight:  [70.4 kg] 70.4 kg (06/06 0500)  General: lying in bed CVS: pulse-normal rate and rhythm RS:  Extremities: normal   Neuro: Mental Status: Patient does not respond to verbal stimuli.  Does not respond to deep sternal rub.  Does not follow commands.  No verbalizations noted.  Cranial Nerves: II: Pupils very sluggish, 2 mm bilaterally III,IV,VI: doll's response present  V,VII: corneal reflex: Absent VIII: patient does not respond to verbal stimuli IX,X: gag reflex: present XI: trapezius strength unable to test bilaterally XII: tongue strength unable to test Motor: Extremities flaccid throughout.  No spontaneous movement noted.  No purposeful movements noted.  Sensory: Does not respond to noxious stimuli in any extremity. Deep Tendon Reflexes:  Absent throughout. Plantars: mute Cerebellar: Unable to perform  Basic Metabolic Panel: Recent Labs  Lab 04/13/2019 1704  04/11/19 0510  04/12/19 0025 04/12/19 0209 04/12/19 0400 04/13/19 0212 04/14/19 0340 04/14/19 0709  NA 137   < > 135   < > 136 134* 135 136  --  136  K 3.2*   < > 5.5*   < > 3.9 3.9 3.9 3.4*  --  3.8  CL 104   < > 101   < > 103 104 104 102  --  101  CO2 23   < > 22   < > 21* 21* 21* 21*  --  24  GLUCOSE 106*   < > 129*   < > 102* 110* 125* 162*  --  144*  BUN 12   < > 12   < > 14 13 15  24*  --  31*  CREATININE 1.01   < > 1.15   < > 1.49* 1.44* 1.46* 1.96*  --  2.08*  CALCIUM 6.6*   < > 6.1*   < > 7.1* 7.0* 7.0* 7.5*  --  7.6*  MG 1.5*  --  1.3*  --   --   --   2.6* 2.3 2.2  --   PHOS <1.0*  --  5.9*  --   --   --  4.5 5.2* 4.1  --    < > = values in this interval not displayed.    CBC: Recent Labs  Lab 04/14/2019 0218  04/11/19 0510 04/11/19 0517 04/12/19 0500 04/13/19 0212 04/14/19 0340  WBC 10.4  --  3.9*  --  6.6 6.9 5.4  NEUTROABS 6.6  --   --   --   --   --   --   HGB 12.9*   < > 13.0 12.9* 11.7* 11.4* 9.4*  HCT 42.1   < > 39.2 38.0* 34.3* 34.5* 29.1*  MCV 100.7*  --  92.0  --  92.5 93.2 94.2  PLT 217  --  234  --  202 199 155   < > = values in this interval not displayed.     Coagulation Studies: No results for input(s): LABPROT, INR in the last 72 hours.  Imaging Reviewed:     ASSESSMENT AND PLAN  63 year old man visiting from [place] status post cardiac arrest with prolonged downtime who underwent hypothermia protocol.  Neurology consulted for prognostication.  Patient examined 72 hours, rewarmed.  Patient does have very sluggish pupillary response, sluggish doll's response but no corneal reflex.  MRI brain pending.  Severe anoxic injury -Given absent corneal reflexes, no spontaneous movements 72 hours is very concerning for severe anoxic injury. -MRI brain pending -Spoke to son- in Information systems manager and updated him that he likely has severe anoxic injury, however would follow up on examination tomorrow as he has just been rewarmed.   Addendum Reviewed MRI brain, consistent with severe anoxic injury.  Updated son- in law regarding findings.  Discussed setting up family meeting patient's wife and discuss goals of care.  Patient is neurologically critically ill due to severe anoxic injury.  He is at high risk of death and assessment requires detailed neurological exam, reviewing multiple databases, lab work as well as discussion with family members.  I spent 35 minutes of neuro critical care time with patient.  Karena Addison Camary Sosa [facility] Pager Number [phone] For questions after 7pm please refer to AMION to  reach the Neurologist on call

## 2019-04-14 NOTE — Progress Notes (Addendum)
NAMEColton Austin, MRN:  [id number], DOB:  July 16, 1956, LOS: 4 ADMISSION DATE:  05/05/19, CONSULTATION DATE: May 05, 2019 REFERRING MD:  ER, CHIEF COMPLAINT: Status post cardiac arrest  Brief History   63 year old post witnessed arrest  History of present illness   Patient is an otherwise healthy 63 year old gentleman visiting from [place] who had a witnessed collapse at about 1130 last night.  CPR was started by EMTs after about 10 or 15 minutes.  He was found to be in PEA and ultimately was resuscitated.  He had a King airway placed at the scene.  Unfortunately in route to the hospital he vomited and had probable significant aspiration with vomitus over his face and significant consolidation of both mid lung fields right greater than left.  Spoke with the patient's son-in-law this evening family requests aggressive care.  His son in law tells me that he has never had any significant major medical problems hospitalizations chronic medications that he takes on a regular basis.  Past Medical History  Unremarkable  Significant Hospital Events   6/2 admit for cardiac arrest  Consults:  None  Procedures:  Intubation 6/2 Central line 6/2 Art line 6/2 >  Significant Diagnostic Tests:  CT head 6/2 > Negative intracranial imaging. No visible infarct or anoxic injury. Lateral atlantodental asymmetry of indeterminate age. If there was trauma, recommend cervical spine CT. Intubation with diffuse sinus fluid levels. EEG 6/2 > This is a severely attenuated electroencephalogram consistent with sedation and hypothermia.  EEG 6/6> This is a severely attenuated electroencephalogram.  No epileptiform activity is noted.    Micro Data:  COVID neg Blood 6/2 > GPR aerobic only >>> Sputum 6/3 >  Antimicrobials:  Vancomycin 6/2 > 6/3 Zosyn 6/2 >  Interim history/subjective:  No major events, remains unresponsive.  Objective   Blood pressure (!) 89/65, pulse (!) 118, temperature 99.9 F (37.7  C), temperature source Oral, resp. rate 14, height 5\' 5"  (1.651 m), weight 70.4 kg, SpO2 98 %. CVP:  [15 mmHg-21 mmHg] 15 mmHg  Vent Mode: PRVC FiO2 (%):  [50 %] 50 % Set Rate:  [14 bmp] 14 bmp Vt Set:  [400 mL] 400 mL PEEP:  [5 cmH20] 5 cmH20 Plateau Pressure:  [32 cmH20-41 cmH20] 32 cmH20   Intake/Output Summary (Last 24 hours) at 04/14/2019 1050 Last data filed at 04/14/2019 0600 Gross per 24 hour  Intake 1346.62 ml  Output 775 ml  Net 571.62 ml   Filed Weights   04/11/19 0500 04/12/19 0500 04/14/19 0500  Weight: 66.9 kg 70.4 kg 70.4 kg    Examination: GEN: elderly man in NAD HEENT: ETT in place with small amount of bloody secretions, developing angioedema and orbital swelling CV: RRR, ext warm PULM: Scattered rhonci, no accessory muscle use GI: distended, firm, hypoactive BS (per nurse this present for days, having BMs). EXT: Trace diffuse anasarca NEURO: no corneal reflex, no doll's eyes, does have pupillary reflex and weak cough/gag, GCS3T, not triggering vent PSYCH: cannot assess SKIN: no rashes   Resolved Hospital Problem list   NA  Assessment & Plan:  # Cardiac arrest with prolonged downtime s/p cooling protocol, rewarmed 04/12/19 02:00 # Ongoing encephalopathy suspect severe anoxia, stable # Shock with differential being neurogenic vs. Septic, improving  Exam c/w severe anoxic brain injury.  Awaiting usual post 72h rewarming for full prognostication (tomorrow AM).  Neurology following.  - Check MRI brain to help with eventual family discussion, MRI c spine already ordered, he does have a metal rod  in his leg with surgery done in [place], this may hinder us - Continue supportive care including ventilator support, TF, levophed titrated to MAP 65 - Zosyn through 6/9 to complete 7 days aspiration PNA therapy - Remove foley, bladder scans - D/C heparin drip, no role for cath any time in near future and EF is preserved.  Switch to lovenox for DVT ppx  Best practice:   Diet:TF Pain/Anxiety/Delirium protocol (if indicated): none indicated, comatose VAP protocol (if indicated): ordered DVT prophylaxis: lovenox GI prophylaxis: Pepcid Glucose control: SSI q4h, have been at goal Mobility: Bedrest Code Status: Full Family Communication: called son-in-law, Dayna BarkerRamesh at [phone]; informed him we would try to give prognosis tomorrow but things were not looking good Disposition: Critically ill/ICU  The patient is critically ill with multiple organ systems failure and requires high complexity decision making for assessment and support, frequent evaluation and titration of therapies, application of advanced monitoring technologies and extensive interpretation of multiple databases. Critical Care Time devoted to patient care services described in this note independent of APP/resident time (if applicable)  is 35 minutes.   Myrla Halstedan Holdyn Poyser MD [facility] 04/14/19 11:16 AM Personal pager: [phone] If unanswered, please page CCM On-call: [phone]

## 2019-04-14 NOTE — Progress Notes (Signed)
Assisted family with tele visit 

## 2019-04-14 NOTE — Progress Notes (Signed)
Spoke with Dr. Lorraine Lax who requests a family meeting along with CCM (Dr. Charlsie Quest).  Family meeting arranged for 12pm Sunday.

## 2019-04-14 NOTE — Progress Notes (Signed)
[place] for heparin Indication: chest pain/ACS  Allergies  Allergen Reactions  . Other Other (See Comments)    Yogurt and cold food Causes congestion    Patient Measurements: Height: 5\' 5"  (165.1 cm) Weight: 155 lb 3.3 oz (70.4 kg) IBW/kg (Calculated) : 61.5 Heparin Dosing Weight: 81.6  Vital Signs: Temp: 99.9 F (37.7 C) (06/06 0700) Temp Source: Oral (06/06 0700) BP: 89/65 (06/06 0700) Pulse Rate: 118 (06/06 0700)  Labs: Recent Labs    04/12/19 0209 04/12/19 0400  04/12/19 0500  04/13/19 0212 04/13/19 1410 04/14/19 0340  HGB  --   --    < > 11.7*  --  11.4*  --  9.4*  HCT  --   --   --  34.3*  --  34.5*  --  29.1*  PLT  --   --   --  202  --  199  --  155  HEPARINUNFRC  --   --   --  0.23*   < > 0.34 0.38 0.45  CREATININE 1.44* 1.46*  --   --   --  1.96*  --   --    < > = values in this interval not displayed.    Estimated Creatinine Clearance: 33.1 mL/min (A) (by C-G formula based on SCr of 1.96 mg/dL (H)).  Assessment: 63 y.o. male admitted s/p PEA arrest. Pharmacy has been consulted to dose heparin.   Heparin level therapeutic at 0.45. No issues with the heparin infusion or overt bleeding noted.    Goal of Therapy:  Heparin level 0.3-0.7 units/ml Monitor platelets by anticoagulation protocol: Yes   Plan:   Continue heparin at 900 units/hr Daily HL and CBC while on heparin  Erin Hearing PharmD., BCPS Clinical Pharmacist 04/14/2019 9:06 AM

## 2019-04-15 DIAGNOSIS — J189 Pneumonia, unspecified organism: Secondary | ICD-10-CM

## 2019-04-15 LAB — MAGNESIUM: Magnesium: 2.2 mg/dL (ref 1.7–2.4)

## 2019-04-15 LAB — CBC
HCT: 28.1 % — ABNORMAL LOW (ref 39.0–52.0)
Hemoglobin: 9 g/dL — ABNORMAL LOW (ref 13.0–17.0)
MCH: 30.5 pg (ref 26.0–34.0)
MCHC: 32 g/dL (ref 30.0–36.0)
MCV: 95.3 fL (ref 80.0–100.0)
Platelets: 158 10*3/uL (ref 150–400)
RBC: 2.95 MIL/uL — ABNORMAL LOW (ref 4.22–5.81)
RDW: 14.9 % (ref 11.5–15.5)
WBC: 5.6 10*3/uL (ref 4.0–10.5)
nRBC: 0.5 % — ABNORMAL HIGH (ref 0.0–0.2)

## 2019-04-15 LAB — CULTURE, BLOOD (ROUTINE X 2)
Culture: NO GROWTH
Special Requests: ADEQUATE

## 2019-04-15 LAB — GLUCOSE, CAPILLARY
Glucose-Capillary: 123 mg/dL — ABNORMAL HIGH (ref 70–99)
Glucose-Capillary: 127 mg/dL — ABNORMAL HIGH (ref 70–99)
Glucose-Capillary: 133 mg/dL — ABNORMAL HIGH (ref 70–99)
Glucose-Capillary: 145 mg/dL — ABNORMAL HIGH (ref 70–99)
Glucose-Capillary: 146 mg/dL — ABNORMAL HIGH (ref 70–99)

## 2019-04-15 LAB — PROCALCITONIN: Procalcitonin: 3.5 ng/mL

## 2019-04-15 LAB — PHOSPHORUS: Phosphorus: 3.2 mg/dL (ref 2.5–4.6)

## 2019-04-15 NOTE — Progress Notes (Signed)
NAMEMccauley Austin, MRN:  [id number], DOB:  06/17/1956, LOS: 5 ADMISSION DATE:  2019/05/01, CONSULTATION DATE: May 01, 2019 REFERRING MD:  ER, CHIEF COMPLAINT: Status post cardiac arrest  Brief History   63 year old post witnessed arrest  History of present illness   Patient is an otherwise healthy 63 year old gentleman visiting from [place] who had a witnessed collapse at about 1130 last night.  CPR was started by EMTs after about 10 or 15 minutes.  He was found to be in PEA and ultimately was resuscitated.  He had a King airway placed at the scene.  Unfortunately in route to the hospital he vomited and had probable significant aspiration with vomitus over his face and significant consolidation of both mid lung fields right greater than left.  Spoke with the patient's son-in-law this evening family requests aggressive care.  His son in law tells me that he has never had any significant major medical problems hospitalizations chronic medications that he takes on a regular basis.  Past Medical History  Unremarkable  Significant Hospital Events   6/2 admit for cardiac arrest  Consults:  None  Procedures:  Intubation 6/2 Central line 6/2 Art line 6/2 >  Significant Diagnostic Tests:  CT head 6/2 > Negative intracranial imaging. No visible infarct or anoxic injury. Lateral atlantodental asymmetry of indeterminate age. If there was trauma, recommend cervical spine CT. Intubation with diffuse sinus fluid levels. EEG 6/2 > This is a severely attenuated electroencephalogram consistent with sedation and hypothermia.  04/14/2019 MRI shows diffuse anoxic injury Micro Data:  COVID neg Blood 6/2 > GPR aerobic only >>> negative Sputum 6/3 > negative  Antimicrobials:  Vancomycin 6/2 > 6/3 Zosyn 6/2 >  Interim history/subjective:  No significant change.  Dynamically stable.  Neurologically poor prognosis  Objective   Blood pressure 108/69, pulse 91, temperature 99.5 F (37.5 C), temperature  source Axillary, resp. rate 15, height 5\' 5"  (1.651 m), weight 70.8 kg, SpO2 97 %. CVP:  [0 mmHg-15 mmHg] 15 mmHg  Vent Mode: PRVC FiO2 (%):  [40 %-50 %] 40 % Set Rate:  [14 bmp-15 bmp] 14 bmp Vt Set:  [400 mL-440 mL] 400 mL PEEP:  [5 cmH20] 5 cmH20 Plateau Pressure:  [28 cmH20-36 cmH20] 36 cmH20   Intake/Output Summary (Last 24 hours) at 04/15/2019 0816 Last data filed at 04/15/2019 0500 Gross per 24 hour  Intake 321.9 ml  Output 800 ml  Net -478.1 ml   Filed Weights   04/12/19 0500 04/14/19 0500 04/15/19 0500  Weight: 70.4 kg 70.4 kg 70.8 kg    Examination: General: 63 year old male who is poorly responsive HEENT: Tracheal tube is in place Neuro: Does not respond to verbal or noxious stimuli. CV: s1s2 rrr, no m/r/g PULM: even/non-labored, lungs bilaterally diminished DU:KGUR, non-tender, bsx4 active  Extremities: warm/dry, 1+ edema  Skin: no rashes or lesions   Resolved Hospital Problem list   NA  Assessment & Plan:  Cardiac arrest: PEA arrest with ~30 minute downtime. Etiology unclear. TTM complete 6/3. Troponin trend peaked at 0.15. Severe anoxic injury MRI demonstrates diffuse anoxic injury.  Restricted diffusion affecting cerebral cerebellar cortex and deep nuclei.  Increased intracranial pressure Appreciate neurology's input Continue to monitor Ongoing discussions with family concerning course of care    Shock: Neurogenic vs septic (aspiration pneumonia). Echo demonstrates vigorous LV function.  04/15/2027 resolved off of vasopressor support Continue to monitor CVP is 12   Sepsis: Aspiration PNA, Gram positive rod bacteremia likely contaminant  Remains on Zosyn respiratory cultures unremarkable  Continue to monitor   Acute hypoxemic respiratory failure due to aspiration PNA and likely a component of congestive heart failure, possibly acute in nature.  Continue full vent support Serial chest x-rays Improving therapy Wean FiO2 as tolerated Bronchodilators     Hyperglycemia with no history of DM CBG (last 3)  Recent Labs    04/14/19 1632 04/14/19 2337 04/15/19 0742  GLUCAP 128* 123* 127*    Sliding-scale insulin protocol  Hypocalcemia  Monitor replete as needed  Best practice:  Diet:TF Pain/Anxiety/Delirium protocol (if indicated): Fentanyl/Versed PRN VAP protocol (if indicated): Yes DVT prophylaxis: Yes GI prophylaxis: Pepcid Glucose control: Monitor Mobility: Bedrest Code Status: Full Family Communication: 04/14/2019 family visited via telecommunications Disposition: Critically ill/ICU  Labs   CBC: Recent Labs  Lab 04/14/2019 0218  04/11/19 0510 04/11/19 0517 04/12/19 0500 04/13/19 0212 04/14/19 0340 04/15/19 0432  WBC 10.4  --  3.9*  --  6.6 6.9 5.4 5.6  NEUTROABS 6.6  --   --   --   --   --   --   --   HGB 12.9*   < > 13.0 12.9* 11.7* 11.4* 9.4* 9.0*  HCT 42.1   < > 39.2 38.0* 34.3* 34.5* 29.1* 28.1*  MCV 100.7*  --  92.0  --  92.5 93.2 94.2 95.3  PLT 217  --  234  --  202 199 155 158   < > = values in this interval not displayed.    Basic Metabolic Panel: Recent Labs  Lab 04/11/19 0510  04/12/19 0025 04/12/19 0209 04/12/19 0400 04/13/19 0212 04/14/19 0340 04/14/19 0709 04/15/19 0432  NA 135   < > 136 134* 135 136  --  136  --   K 5.5*   < > 3.9 3.9 3.9 3.4*  --  3.8  --   CL 101   < > 103 104 104 102  --  101  --   CO2 22   < > 21* 21* 21* 21*  --  24  --   GLUCOSE 129*   < > 102* 110* 125* 162*  --  144*  --   BUN 12   < > 14 13 15  24*  --  31*  --   CREATININE 1.15   < > 1.49* 1.44* 1.46* 1.96*  --  2.08*  --   CALCIUM 6.1*   < > 7.1* 7.0* 7.0* 7.5*  --  7.6*  --   MG 1.3*  --   --   --  2.6* 2.3 2.2  --  2.2  PHOS 5.9*  --   --   --  4.5 5.2* 4.1  --  3.2   < > = values in this interval not displayed.   GFR: Estimated Creatinine Clearance: 31.2 mL/min (A) (by C-G formula based on SCr of 2.08 mg/dL (H)). Recent Labs  Lab 04/15/2019 0218 05/04/2019 0417  04/12/19 0500 04/13/19 0212 04/13/19  0938 04/14/19 0340 04/15/19 0432  PROCALCITON <0.10  --   --   --   --  7.75 5.62 3.50  WBC 10.4  --    < > 6.6 6.9  --  5.4 5.6  LATICACIDVEN 10.6* 8.2*  --   --   --   --   --   --    < > = values in this interval not displayed.    Liver Function Tests: Recent Labs  Lab 04/15/2019 0218 04/11/19 0510  AST 79* 53*  ALT 56* 38  ALKPHOS 64 46  BILITOT 0.8 1.5*  PROT 6.5 5.1*  ALBUMIN 3.4* 2.4*   No results for input(s): LIPASE, AMYLASE in the last 168 hours. No results for input(s): AMMONIA in the last 168 hours.  ABG    Component Value Date/Time   PHART 7.338 (L) 04/11/2019 0517   PCO2ART 41.3 04/11/2019 0517   PO2ART 68.0 (L) 04/11/2019 0517   HCO3 23.2 04/11/2019 0517   TCO2 25 04/11/2019 0517   ACIDBASEDEF 4.0 (H) 04/11/2019 0517   O2SAT 95.0 04/11/2019 0517     Coagulation Profile: Recent Labs  Lab 08/29/2019 0417 08/29/2019 1142  INR 1.1 1.3*    Cardiac Enzymes: Recent Labs  Lab 08/29/2019 0218 08/29/2019 0417 08/29/2019 0801 08/29/2019 1704 08/29/2019 2213  TROPONINI 0.05* 0.09* 0.13* 0.15* 0.12*    HbA1C: No results found for: HGBA1C  CBG: Recent Labs  Lab 04/14/19 1130 04/14/19 1144 04/14/19 1632 04/14/19 2337 04/15/19 0742  GLUCAP 105* 144* 128* 123* 127*     Critical care time: 30 min     Brett CanalesSteve Minor ACNP [facility] Pager [phone] till 1 pm If no answer page [phone] 04/15/2019, 8:16 AM

## 2019-04-15 NOTE — Progress Notes (Signed)
Reason for consult:  Anoxic injury  Subjective: No significant events overnight.    ROS: negative except above  Examination  Vital signs in last 24 hours: Temp:  [98.4 F (36.9 C)-100.3 F (37.9 C)] 98.9 F (37.2 C) (06/07 1133) Pulse Rate:  [91-122] 91 (06/07 0700) Resp:  [12-19] 19 (06/07 1127) BP: (95-135)/(57-75) 135/63 (06/07 0831) SpO2:  [96 %-100 %] 98 % (06/07 1127) Arterial Line BP: (84-147)/(53-74) 144/61 (06/07 0700) FiO2 (%):  [40 %] 40 % (06/07 1127) Weight:  [70.8 kg] 70.8 kg (06/07 0500)  General: lying in bed CVS: pulse-normal rate and rhythm RS: breathing comfortably Extremities: normal   Neuro: Mental Status: Patient does not respond to verbal stimuli.  Does not respond to deep sternal rub.  Does not follow commands.  No verbalizations noted.  Cranial Nerves: II: patient does not respond confrontation bilaterally, pupils not reactive,  III,IV,VI: doll's response present V,VII: corneal reflex: absent VIII: patient does not respond to verbal stimuli IX,X: gag reflex: present  XI: trapezius strength unable to test bilaterally XII: tongue strength unable to test Motor: Extremities flaccid throughout.  No spontaneous movement noted.  No purposeful movements noted.  Sensory: Does not respond to noxious stimuli in any extremity. Deep Tendon Reflexes:  Absent throughout. Plantars: mute Cerebellar: Unable to perform  Basic Metabolic Panel: Recent Labs  Lab 04/11/19 0510  04/12/19 0025 04/12/19 0209 04/12/19 0400 04/13/19 0212 04/14/19 0340 04/14/19 0709 04/15/19 0432  NA 135   < > 136 134* 135 136  --  136  --   K 5.5*   < > 3.9 3.9 3.9 3.4*  --  3.8  --   CL 101   < > 103 104 104 102  --  101  --   CO2 22   < > 21* 21* 21* 21*  --  24  --   GLUCOSE 129*   < > 102* 110* 125* 162*  --  144*  --   BUN 12   < > 14 13 15  24*  --  31*  --   CREATININE 1.15   < > 1.49* 1.44* 1.46* 1.96*  --  2.08*  --   CALCIUM 6.1*   < > 7.1* 7.0* 7.0* 7.5*  --   7.6*  --   MG 1.3*  --   --   --  2.6* 2.3 2.2  --  2.2  PHOS 5.9*  --   --   --  4.5 5.2* 4.1  --  3.2   < > = values in this interval not displayed.    CBC: Recent Labs  Lab 04/09/2019 0218  04/11/19 0510 04/11/19 0517 04/12/19 0500 04/13/19 0212 04/14/19 0340 04/15/19 0432  WBC 10.4  --  3.9*  --  6.6 6.9 5.4 5.6  NEUTROABS 6.6  --   --   --   --   --   --   --   HGB 12.9*   < > 13.0 12.9* 11.7* 11.4* 9.4* 9.0*  HCT 42.1   < > 39.2 38.0* 34.3* 34.5* 29.1* 28.1*  MCV 100.7*  --  92.0  --  92.5 93.2 94.2 95.3  PLT 217  --  234  --  202 199 155 158   < > = values in this interval not displayed.     Coagulation Studies: No results for input(s): LABPROT, INR in the last 72 hours.  Imaging Reviewed:     ASSESSMENT AND PLAN  63 year old man visiting from  [place] status post cardiac arrest with prolonged downtime who underwent hypothermia protocol.  Neurology consulted for prognostication.  Patient examined 72 hours, rewarmed.  Patient does not have pupillary response today and no corneal reflex. Dolls present and weak gag. No spontaneous movement.   Severe anoxic injury -Given absent corneal reflexes, no spontaneous movements > 72 hours suggestive of very poor prognosis. - MRI brain shows severe anoxic injury     Spent 35 min with family discussing poor  Prognosis.    Georgiana SpinnerSushanth Mackenze [facility] Pager Number [phone] For questions after 7pm please refer to AMION to reach the Neurologist on call

## 2019-04-16 ENCOUNTER — Other Ambulatory Visit: Payer: Self-pay

## 2019-04-16 ENCOUNTER — Inpatient Hospital Stay (HOSPITAL_COMMUNITY): Payer: PRIVATE HEALTH INSURANCE

## 2019-04-16 DIAGNOSIS — Z66 Do not resuscitate: Secondary | ICD-10-CM

## 2019-04-16 DIAGNOSIS — J9621 Acute and chronic respiratory failure with hypoxia: Secondary | ICD-10-CM

## 2019-04-16 DIAGNOSIS — Z515 Encounter for palliative care: Secondary | ICD-10-CM

## 2019-04-16 DIAGNOSIS — G931 Anoxic brain damage, not elsewhere classified: Secondary | ICD-10-CM

## 2019-04-16 DIAGNOSIS — R0600 Dyspnea, unspecified: Secondary | ICD-10-CM

## 2019-04-16 LAB — BASIC METABOLIC PANEL
Anion gap: 10 (ref 5–15)
BUN: 32 mg/dL — ABNORMAL HIGH (ref 8–23)
CO2: 25 mmol/L (ref 22–32)
Calcium: 7.8 mg/dL — ABNORMAL LOW (ref 8.9–10.3)
Chloride: 103 mmol/L (ref 98–111)
Creatinine, Ser: 1.71 mg/dL — ABNORMAL HIGH (ref 0.61–1.24)
GFR calc Af Amer: 48 mL/min — ABNORMAL LOW (ref >60)
GFR calc non Af Amer: 41 mL/min — ABNORMAL LOW (ref >60)
Glucose, Bld: 144 mg/dL — ABNORMAL HIGH (ref 70–99)
Potassium: 4.3 mmol/L (ref 3.5–5.1)
Sodium: 138 mmol/L (ref 135–145)

## 2019-04-16 LAB — CBC WITH DIFFERENTIAL/PLATELET
Abs Immature Granulocytes: 0.31 10*3/uL — ABNORMAL HIGH (ref 0.00–0.07)
Basophils Absolute: 0 10*3/uL (ref 0.0–0.1)
Basophils Relative: 0 %
Eosinophils Absolute: 0.1 10*3/uL (ref 0.0–0.5)
Eosinophils Relative: 2 %
HCT: 27 % — ABNORMAL LOW (ref 39.0–52.0)
Hemoglobin: 8.6 g/dL — ABNORMAL LOW (ref 13.0–17.0)
Immature Granulocytes: 5 %
Lymphocytes Relative: 11 %
Lymphs Abs: 0.7 10*3/uL (ref 0.7–4.0)
MCH: 30.6 pg (ref 26.0–34.0)
MCHC: 31.9 g/dL (ref 30.0–36.0)
MCV: 96.1 fL (ref 80.0–100.0)
Monocytes Absolute: 0.6 10*3/uL (ref 0.1–1.0)
Monocytes Relative: 10 %
Neutro Abs: 4.3 10*3/uL (ref 1.7–7.7)
Neutrophils Relative %: 72 %
Platelets: 180 10*3/uL (ref 150–400)
RBC: 2.81 MIL/uL — ABNORMAL LOW (ref 4.22–5.81)
RDW: 15.1 % (ref 11.5–15.5)
WBC: 6 10*3/uL (ref 4.0–10.5)
nRBC: 0.5 % — ABNORMAL HIGH (ref 0.0–0.2)

## 2019-04-16 LAB — GLUCOSE, CAPILLARY
Glucose-Capillary: 105 mg/dL — ABNORMAL HIGH (ref 70–99)
Glucose-Capillary: 124 mg/dL — ABNORMAL HIGH (ref 70–99)
Glucose-Capillary: 135 mg/dL — ABNORMAL HIGH (ref 70–99)
Glucose-Capillary: 159 mg/dL — ABNORMAL HIGH (ref 70–99)
Glucose-Capillary: 127 mg/dL — ABNORMAL HIGH (ref 70–99)
Glucose-Capillary: 151 mg/dL — ABNORMAL HIGH (ref 70–99)

## 2019-04-16 LAB — MAGNESIUM: Magnesium: 2.4 mg/dL (ref 1.7–2.4)

## 2019-04-16 LAB — PHOSPHORUS: Phosphorus: 3.3 mg/dL (ref 2.5–4.6)

## 2019-04-16 MED ORDER — MORPHINE BOLUS VIA INFUSION
5.0000 mg | INTRAVENOUS | Status: DC | PRN
  Filled 2019-04-16: qty 5

## 2019-04-16 MED ORDER — GLYCOPYRROLATE 0.2 MG/ML IJ SOLN: 0.2000 mg | INTRAMUSCULAR | Status: DC | PRN

## 2019-04-16 MED ORDER — MORPHINE SULFATE (PF) 2 MG/ML IV SOLN: 2.0000 mg | INTRAVENOUS | Status: DC | PRN

## 2019-04-16 MED ORDER — LORAZEPAM 2 MG/ML IJ SOLN: 1.0000 mg | INTRAMUSCULAR | Status: DC | PRN

## 2019-04-16 MED ORDER — DEXTROSE 5 % IV SOLN: INTRAVENOUS | Status: DC

## 2019-04-16 MED ORDER — GLYCOPYRROLATE 1 MG PO TABS
1.0000 mg | ORAL_TABLET | ORAL | Status: DC | PRN
  Filled 2019-04-16: qty 1

## 2019-04-16 MED ORDER — ACETAMINOPHEN 650 MG RE SUPP: 650.0000 mg | Freq: Four times a day (QID) | RECTAL | Status: DC | PRN

## 2019-04-16 MED ORDER — MORPHINE 100MG IN NS 100ML (1MG/ML) PREMIX INFUSION
0.0000 mg/h | INTRAVENOUS | Status: DC
  Administered 2019-04-16: 1 mg/h via INTRAVENOUS
  Filled 2019-04-16: qty 100

## 2019-04-16 MED ORDER — ACETAMINOPHEN 325 MG PO TABS: 650.0000 mg | ORAL_TABLET | Freq: Four times a day (QID) | ORAL | Status: DC | PRN

## 2019-04-16 MED ORDER — DIPHENHYDRAMINE HCL 50 MG/ML IJ SOLN: 25.0000 mg | INTRAMUSCULAR | Status: DC | PRN

## 2019-04-16 MED ORDER — POLYVINYL ALCOHOL 1.4 % OP SOLN
1.0000 [drp] | Freq: Four times a day (QID) | OPHTHALMIC | Status: DC | PRN
  Filled 2019-04-16: qty 15

## 2019-04-17 DIAGNOSIS — R0600 Dyspnea, unspecified: Secondary | ICD-10-CM

## 2019-04-17 DIAGNOSIS — Z515 Encounter for palliative care: Secondary | ICD-10-CM

## 2019-04-17 DIAGNOSIS — J9621 Acute and chronic respiratory failure with hypoxia: Secondary | ICD-10-CM

## 2019-04-17 DIAGNOSIS — Z66 Do not resuscitate: Secondary | ICD-10-CM

## 2019-04-18 ENCOUNTER — Telehealth: Payer: Self-pay

## 2019-04-18 DIAGNOSIS — N179 Acute kidney failure, unspecified: Secondary | ICD-10-CM

## 2019-04-18 DIAGNOSIS — G931 Anoxic brain damage, not elsewhere classified: Secondary | ICD-10-CM

## 2019-04-18 DIAGNOSIS — J690 Pneumonitis due to inhalation of food and vomit: Secondary | ICD-10-CM

## 2019-04-18 NOTE — Telephone Encounter (Signed)
Received dc from [employer].. DC is for cremation and patient is a patient of Doctor Icard.   DC will be taken to [facility] for signature.  Received dc back from [facility].  I called the funeral home to let them know the dc is ready for pickup.

## 2019-05-09 NOTE — Death Summary Note (Signed)
DEATH SUMMARY   Patient Details  Name: Adam Austin MRN: [id number] DOB: [birth year]  Admission/Discharge Information   Admit Date:  04/26/2019  Date of Death: Date of Death: May 02, 2019  Time of Death: Time of Death: 1700  Length of Stay: December 30, 2022  Referring Physician: Patient, No Pcp Per   Reason(s) for Hospitalization  63 year old M visiting from [place] for the birth of a grandchild admitted 2023-04-26 post witnessed arrest.  Delayed CPR (10-15 minutes), then CPR x10-15 per EMS. Vomited en route with concern for significant aspiration. Status post TTM.  Neuroimaging with diffuse anoxic injury.    Diagnoses  Preliminary cause of death:  Secondary Diagnoses (including complications and co-morbidities):  Active Problems:   Cardiac arrest [facility])   Encounter for central line placement   Multifocal pneumonia   Acute on chronic respiratory failure with hypoxia ([place])   Palliative care by specialist   DNR (do not resuscitate)   Dyspnea   Anoxic brain injury ([place])   Acute renal failure (ARF) ([place])   Aspiration pneumonia [facility])   Brief Hospital Course (including significant findings, care, treatment, and services provided and events leading to death)  Adam Austin is a 63 year old M visiting from [place] for the birth of a grandchild admitted Apr 26, 2023 post witnessed arrest.  Delayed CPR (10-15 minutes), then CPR x10-15 per EMS. Vomited en route with concern for significant aspiration. Status post TTM.  Neuroimaging with diffuse anoxic injury.    Following TTM management protocol the patient did not have any neurologic recovery.  Additional neuro imaging was completed as well as evaluation by our neurology colleagues.  At this point there was concern for irreversible anoxic brain injury.  Patient met with palliative care.  The decision was made to pursue liberation from mechanical ventilator and allow passing of a natural death.  Patient passed on comfort measures with family at bedside.   Pertinent  Labs and Studies  Significant Diagnostic Studies Dg Chest 1 View  Result Date: 04/13/2019 CLINICAL DATA:  63 year old male cardiac arrest. Negative for COVID-19. On 04/26/2019. EXAM: CHEST  1 VIEW COMPARISON:  04/11/2019 and earlier. FINDINGS: Portable AP semi upright view at 0907 hours. Endotracheal tube tip in good position at the level the clavicles. Stable left IJ central line. Enteric tube courses to the abdomen, tip not included. Stable lung volumes and mediastinal contours. Mildly regressed confluent bilateral perihilar and medial lung base opacity. No pneumothorax. No pleural effusion is evident. Upper lung pulmonary vascularity is normal. Paucity of bowel gas in the upper abdomen. Stable visualized osseous structures. IMPRESSION: 1. Stable lines and tubes. 2. Mildly regressed bilateral perihilar opacity. No new cardiopulmonary abnormality. Electronically Signed   By: Genevie Ann M.D.   On: 04/13/2019 09:41   Ct Head Wo Contrast  Result Date: 04/26/19 CLINICAL DATA:  Cardiac arrest EXAM: CT HEAD WITHOUT CONTRAST TECHNIQUE: Contiguous axial images were obtained from the base of the skull through the vertex without intravenous contrast. COMPARISON:  None. FINDINGS: Brain: No evidence of acute infarction, hemorrhage, hydrocephalus, extra-axial collection or mass lesion/mass effect. No evidence of generalized nodular injury. Brain volume is normal. Vascular: Symmetric vessel density. Mild atherosclerotic calcification. Skull: No acute or aggressive finding. Asymmetric lateral atlantodental width, 6 mm on the left. No visible superimposed fracture. Sinuses/Orbits: Intubation with nasopharyngeal fluid and diffuse fluid levels in the paranasal sinuses. These results were called by telephone at the time of interpretation on Apr 26, 2019 at 6:10 am to Dr. Laurelyn Sickle , who verbally acknowledged these results. IMPRESSION: 1. Negative intracranial  imaging. No visible infarct or anoxic injury. 2. Lateral atlantodental  asymmetry of indeterminate age. If there was trauma, recommend cervical spine CT. 3. Intubation with diffuse sinus fluid levels. Electronically Signed   By: Monte Fantasia M.D.   On: 05/03/2019 06:10   Mr Brain Wo Contrast  Result Date: 04/14/2019 CLINICAL DATA:  Status post cardiac arrest.  Prolonged down time. EXAM: MRI HEAD WITHOUT CONTRAST TECHNIQUE: Multiplanar, multiecho pulse sequences of the brain and surrounding structures were obtained without intravenous contrast. COMPARISON:  CT head 04/26/2019 FINDINGS: Brain: Diffuse cortical cerebral hemispheric restricted diffusion, corresponding low ADC, throughout both hemispheres, as well as the basal ganglia, and BILATERAL cerebellar cortex. Findings are consistent with severe anoxic injury. No hemorrhage, mass lesion,   or extra-axial fluid. There are signs of increased intracranial pressure, with swelling particularly of the cerebellum. There is effacement of the fourth ventricle and aqueduct, and early hydrocephalus is developing. Early BILATERAL tonsillar herniation through the foramen magnum. Vascular: Normal flow voids. Skull and upper cervical spine: Normal marrow signal. Sinuses/Orbits: Pan sinus opacity.  Negative orbits. Other: BILATERAL mastoid effusions. Nasopharyngeal edema. Endotracheal intubation. IMPRESSION: Severe diffuse anoxic injury. Restricted diffusion affecting cerebral and cerebellar cortex, and deep nuclei. Increased intracranial pressure, early hydrocephalus due to obstruction at the level of the aqueduct, fourth ventricle, and/or foramen magnum. These results were communicated to Dr. Lorraine Lax at 2:35 pmon 6/6/2020by text page via the [facility] messaging system. Electronically Signed   By: Staci Righter M.D.   On: 04/14/2019 14:36   Mr Cervical Spine Wo Contrast  Result Date: 04/14/2019 CLINICAL DATA:  Cardiac arrest with severe anoxic brain injury. Lateral atlantodental asymmetry on CT head. Evaluate for trauma. EXAM: MRI CERVICAL SPINE  WITHOUT CONTRAST TECHNIQUE: Multiplanar, multisequence MR imaging of the cervical spine was performed. No intravenous contrast was administered. COMPARISON:  MR brain from same day.  CT head dated April 10, 2019. FINDINGS: Alignment: Straightening of the normal cervical lordosis. Sagittal alignment is maintained. Vertebrae: No fracture, evidence of discitis, or bone lesion. Unchanged asymmetric lateral atlantodental width, measuring 6 mm on the left. Cord: Normal signal and morphology. Posterior Fossa, vertebral arteries, paraspinal tissues: Unchanged early bilateral cerebellar tonsillar herniation through the foramen magnum, similar to MRI brain from same day. Prominent naso-and oropharyngeal edema noted. Endotracheal and enteric tubes noted. Disc levels: Moderate stenosis and flattening of the cervicomedullary junction due to tonsillar herniation. C2-C3:  Negative. C3-C4:  Negative. C4-C5: Negative disc. Mild left facet uncovertebral hypertrophy. Mild left neuroforaminal stenosis. No spinal canal or right neuroforaminal stenosis. C5-C6: Minimal disc bulging. Mild bilateral uncovertebral hypertrophy. Mild bilateral neuroforaminal stenosis. No spinal canal stenosis. C6-C7:  Minimal disc bulging.  No stenosis. C7-T1:  Negative. No spinal canal or neuroforaminal stenosis in the visualized upper thoracic spine. IMPRESSION: 1. Unchanged lateral atlantodental asymmetry without surrounding soft tissue edema or fracture, likely chronic. 2. Unchanged moderate stenosis and flattening of the cervicomedullary junction due to cerebellar tonsillar herniation as seen on MRI brain from same day. 3. Mild cervical spondylosis without high-grade stenosis or impingement. Electronically Signed   By: Titus Dubin M.D.   On: 04/14/2019 15:04   Dg Chest Port 1 View  Result Date: 05/14/19 CLINICAL DATA:  Respiratory failure EXAM: PORTABLE CHEST 1 VIEW COMPARISON:  04/13/2019 FINDINGS: Cardiac shadow is stable. Endotracheal tube,  nasogastric catheter and left jugular central line are again seen and stable. Patchy infiltrative changes are again seen bilaterally and stable from the prior exam. No new abnormality is seen. IMPRESSION: Stable bilateral  infiltrates.  No new focal abnormality is noted. Electronically Signed   By: Inez Catalina M.D.   On: Apr 29, 2019 07:39   Dg Chest Port 1 View  Result Date: 04/11/2019 CLINICAL DATA:  Check endotracheal tube EXAM: PORTABLE CHEST 1 VIEW COMPARISON:  04/09/2019 FINDINGS: Endotracheal tube, nasogastric catheter jugular central are again seen. Patchy infiltrates are again identified bilaterally consistent with patient's given clinical history of aspiration. No pneumothorax or sizable effusion is seen. IMPRESSION: Stable bilateral infiltrates. Electronically Signed   By: Inez Catalina M.D.   On: 04/11/2019 08:24   Dg Chest Port 1 View  Result Date: 04/28/2019 CLINICAL DATA:  Cardiac arrest. EXAM: PORTABLE CHEST 1 VIEW COMPARISON:  Earlier today FINDINGS: Endotracheal tube tip just below the clavicular heads. The orogastric tube reaches the stomach. Left IJ line with tip at the upper SVC. Dense bilateral airspace disease with worsening right upper lobe aeration. Borderline heart size. No pneumothorax or visible pleural fluid. No mediastinal widening. IMPRESSION: 1. New central line without complicating feature. 2. History of aspiration with worsening airspace disease. Electronically Signed   By: Monte Fantasia M.D.   On: 04/23/2019 07:13   Dg Chest Portable 1 View  Result Date: 05/03/2019 CLINICAL DATA:  Endotracheal tube placement EXAM: PORTABLE CHEST 1 VIEW COMPARISON:  04/28/2019 FINDINGS: Due to technical issues, this study was placed under a separate accession number which was previously dictated. The endotracheal tube terminated in the left mainstem bronchus on that study. On this study, the endotracheal tube terminates 2.7 cm above carina. The enteric tube extends below the left  hemidiaphragm. The cardiac silhouette is mildly enlarged. Diffuse bilateral hazy airspace opacities are again noted. IMPRESSION: 1. Well-positioned endotracheal tube.  Lines and tubes as above. 2. Multifocal airspace opacities concerning for multifocal pneumonia or aspiration. Electronically Signed   By: Constance Holster M.D.   On: 04/30/2019 02:12   Dg Chest Port 1 View  Result Date: 04/13/2019 CLINICAL DATA:  Post CPR. EXAM: PORTABLE CHEST 1 VIEW COMPARISON:  None FINDINGS: Initial radiograph demonstrates that the endotracheal tube terminates in the left mainstem bronchus. Subsequent images demonstrate positioning of the endotracheal tube above the carina by approximately 2.7 cm. The enteric tube extends below the left hemidiaphragm. Diffuse bilateral hazy airspace opacities are noted. There is no pneumothorax or large pleural effusion. There is no acute osseous abnormality detected. IMPRESSION: 1. Endotracheal tube terminates 2.7 cm above the carina on the second image provided. The enteric tube extends below the left hemidiaphragm. 2. Diffuse hazy bilateral airspace opacities concerning for multifocal pneumonia or aspiration. Electronically Signed   By: Constance Holster M.D.   On: 04/09/2019 02:03    Microbiology Recent Results (from the past 240 hour(s))  SARS Coronavirus 2 [facility] order, Performed in [facility] hospital lab)     Status: None   Collection Time: 04/26/2019  1:45 AM  Result Value Ref Range Status   SARS Coronavirus 2 NEGATIVE NEGATIVE Final    Comment: (NOTE) If result is NEGATIVE SARS-CoV-2 target nucleic acids are NOT DETECTED. The SARS-CoV-2 RNA is generally detectable in upper and lower  respiratory specimens during the acute phase of infection. The lowest  concentration of SARS-CoV-2 viral copies this assay can detect is 250  copies / mL. A negative result does not preclude SARS-CoV-2 infection  and should not be used as the sole basis for treatment or other  patient  management decisions.  A negative result may occur with  improper specimen collection / handling, submission of specimen  other  than nasopharyngeal swab, presence of viral mutation(s) within the  areas targeted by this assay, and inadequate number of viral copies  (<250 copies / mL). A negative result must be combined with clinical  observations, patient history, and epidemiological information. If result is POSITIVE SARS-CoV-2 target nucleic acids are DETECTED. The SARS-CoV-2 RNA is generally detectable in upper and lower  respiratory specimens dur ing the acute phase of infection.  Positive  results are indicative of active infection with SARS-CoV-2.  Clinical  correlation with patient history and other diagnostic information is  necessary to determine patient infection status.  Positive results do  not rule out bacterial infection or co-infection with other viruses. If result is PRESUMPTIVE POSTIVE SARS-CoV-2 nucleic acids MAY BE PRESENT.   A presumptive positive result was obtained on the submitted specimen  and confirmed on repeat testing.  While 2019 novel coronavirus  (SARS-CoV-2) nucleic acids may be present in the submitted sample  additional confirmatory testing may be necessary for epidemiological  and / or clinical management purposes  to differentiate between  SARS-CoV-2 and other Sarbecovirus currently known to infect humans.  If clinically indicated additional testing with an alternate test  methodology [phone]) is advised. The SARS-CoV-2 RNA is generally  detectable in upper and lower respiratory sp ecimens during the acute  phase of infection. The expected result is Negative. Fact Sheet for Patients:  StrictlyIdeas.no Fact Sheet for Healthcare Providers: BankingDealers.co.za This test is not yet approved or cleared by the [place] FDA and has been authorized for detection and/or diagnosis of SARS-CoV-2 by FDA under  an Emergency Use Authorization (EUA).  This EUA will remain in effect (meaning this test can be used) for the duration of the COVID-19 declaration under Section 564(b)(1) of the Act, 21 U.S.C. section 360bbb-3(b)(1), unless the authorization is terminated or revoked sooner. Performed at [facility], [place] [address] [place], [place] [address]   Blood Culture (routine x 2)     Status: Abnormal   Collection Time: 04/28/2019  2:05 AM  Result Value Ref Range Status   Specimen Description BLOOD LEFT HAND  Final   Special Requests   Final    BOTTLES DRAWN AEROBIC AND ANAEROBIC Blood Culture results may not be optimal due to an inadequate volume of blood received in culture bottles   Culture  Setup Time   Final    GRAM POSITIVE RODS AEROBIC BOTTLE ONLY CRITICAL RESULT CALLED TO, READ BACK BY AND VERIFIED WITH: J. LEDFORD,PHARMD [id number] 04/12/2019 T. TYSOR    Culture (A)  Final    DIPHTHEROIDS(CORYNEBACTERIUM SPECIES) Standardized susceptibility testing for this organism is not available.    Report Status 04/13/2019 FINAL  Final  Blood Culture (routine x 2)     Status: None   Collection Time: 04/19/2019  2:31 AM  Result Value Ref Range Status   Specimen Description BLOOD LEFT WRIST  Final   Special Requests   Final    BOTTLES DRAWN AEROBIC AND ANAEROBIC Blood Culture adequate volume   Culture   Final    NO GROWTH 5 DAYS Performed at [facility], [address], [place], [place] [address]    Report Status 04/15/2019 FINAL  Final  MRSA PCR Screening     Status: None   Collection Time: 04/12/2019  5:52 AM  Result Value Ref Range Status   MRSA by PCR NEGATIVE NEGATIVE Final    Comment:        The GeneXpert MRSA Assay (FDA approved  for NASAL specimens only), is one component of a comprehensive MRSA colonization surveillance program. It is not intended to diagnose MRSA infection nor to guide or monitor treatment for MRSA infections. Performed at [facility], [place]  [address], [place], [place] [address]   Culture, respiratory (non-expectorated)     Status: None   Collection Time: 04/11/19 10:46 AM  Result Value Ref Range Status   Specimen Description TRACHEAL ASPIRATE  Final   Special Requests NONE  Final   Gram Stain NO WBC SEEN NO ORGANISMS SEEN   Final   Culture   Final    RARE Consistent with normal respiratory flora. Performed at [facility], [place] [address], [place], [place] [address]    Report Status 04/13/2019 FINAL  Final    Lab Basic Metabolic Panel: Recent Labs  Lab 04/12/19 0209 04/12/19 0400 04/13/19 0212 04/14/19 0340 04/14/19 0709 04/15/19 0432 04-21-19 0405  NA 134* 135 136  --  136  --  138  K 3.9 3.9 3.4*  --  3.8  --  4.3  CL 104 104 102  --  101  --  103  CO2 21* 21* 21*  --  24  --  25  GLUCOSE 110* 125* 162*  --  144*  --  144*  BUN 13 15 24*  --  31*  --  32*  CREATININE 1.44* 1.46* 1.96*  --  2.08*  --  1.71*  CALCIUM 7.0* 7.0* 7.5*  --  7.6*  --  7.8*  MG  --  2.6* 2.3 2.2  --  2.2 2.4  PHOS  --  4.5 5.2* 4.1  --  3.2 3.3   Liver Function Tests: No results for input(s): AST, ALT, ALKPHOS, BILITOT, PROT, ALBUMIN in the last 168 hours. No results for input(s): LIPASE, AMYLASE in the last 168 hours. No results for input(s): AMMONIA in the last 168 hours. CBC: Recent Labs  Lab 04/12/19 0500 04/13/19 0212 04/14/19 0340 04/15/19 0432 04-21-19 0405  WBC 6.6 6.9 5.4 5.6 6.0  NEUTROABS  --   --   --   --  4.3  HGB 11.7* 11.4* 9.4* 9.0* 8.6*  HCT 34.3* 34.5* 29.1* 28.1* 27.0*  MCV 92.5 93.2 94.2 95.3 96.1  PLT 202 199 155 158 180   Cardiac Enzymes: No results for input(s): CKTOTAL, CKMB, CKMBINDEX, TROPONINI in the last 168 hours. Sepsis Labs: Recent Labs  Lab 04/13/19 0212 04/13/19 0938 04/14/19 0340 04/15/19 0432 Apr 21, 2019 0405  PROCALCITON  --  7.75 5.62 3.50  --   WBC 6.9  --  5.4 5.6 6.0    Procedures/Operations  Intubation 6/2 > [place] line 6/2 > Art line 6/2 >  Korinne Greenstein L  Antonios Ostrow 04/18/2019, 10:50 AM

## 2019-05-09 NOTE — Progress Notes (Signed)
Patient has expired as of 1700.  Family at bedside, son in law Maren Beach and his wife.   Dr Valeta Harms notified of patients death

## 2019-05-09 NOTE — Consult Note (Signed)
Consultation Note Date: 04/17/2019   Patient Name: Adam Austin  DOB: 07-Sep-1956  MRN: [id number]  Age / Sex: 63 y.o., male  PCP: Patient, No Pcp Per Referring Physician: Candee Furbish, MD  Reason for Consultation: Establishing goals of care and Psychosocial/spiritual support  HPI/Patient Profile: 63 y.o. male visiting from [place] for the birth of a grandchild ,   admitted on 04/12/2019  post witnessed arrest.  Delayed CPR (10-15 minutes), then CPR x10-15 per EMS. Vomited en route with concern for significant aspiration. Status post TTM.  Neuroimaging with diffuse anoxic injury.    Family facing decisions regarding de-escalation of care and a shift to comfort and dignity.   Clinical Assessment and Goals of Care:   This NP Wadie Lessen reviewed medical records, received report from team, assessed the patient and then meet at the patient's bedside and then spoke to son in law/Ramesh, daughter and wife   to discuss diagnosis, prognosis, GOC, EOL wishes disposition and options.  Concept of Hospice and Palliative Care were discussed  A discussion was had today regarding advanced directives.   The difference between a aggressive medical intervention path  and a palliative comfort care path for this patient at this time was had.  Values and goals of care important to patient and family were attempted to be elicited.  Ramech Moxon /son in Conservation officer, historic buildings an understanding of the seriousness of the medical sitiaton and grim prognosis.  Family agree with liberation from the vent and allowing a natural death. Focus is comfort and dignity   Natural trajectory and expectations at EOL were discussed.  Questions and concerns addressed.   Family encouraged to call with questions or concerns.    PMT will continue to support holistically.    NEXT OF KIN/ wife, daughter and SIL making decisions for this patient.     SUMMARY OF RECOMMENDATIONS    Code Status/Advance Care Planning:  DNR   Family spent time at the bedside grieving.  Facetimed with family in [place]  They preferred to leave the room for extubation  One way extubation with a shift to full comfort   Symptom Management:   Dyspnea/Pain: morphine gtt with titration  Terminal secretions: Robinul  Agitation: Ativan    Additional Recommendations (Limitations, Scope, Preferences):  Full Comfort Care  Psycho-social/Spiritual:   Desire for further Chaplaincy support:no  Additional Recommendations: Grief/Bereavement Support   Created space and opportunity for family to gather at the bedside    Prognosis:   Hours - Days  Discharge Planning: Anticipated Hospital Death      Primary Diagnoses: Present on Admission: . Cardiac arrest ([place])   I have reviewed the medical record, interviewed the patient and family, and examined the patient. The following aspects are pertinent.  History reviewed. No pertinent past medical history. Social History   Socioeconomic History  . Marital status: Married    Spouse name: Not on file  . Number of children: Not on file  . Years of education: Not on file  . Highest education level:  Not on file  Occupational History  . Not on file  Social Needs  . Financial resource strain: Not on file  . Food insecurity:    Worry: Not on file    Inability: Not on file  . Transportation needs:    Medical: Not on file    Non-medical: Not on file  Tobacco Use  . Smoking status: Unknown If Ever Smoked  Substance and Sexual Activity  . Alcohol use: Not on file  . Drug use: Not on file  . Sexual activity: Not on file  Lifestyle  . Physical activity:    Days per week: Not on file    Minutes per session: Not on file  . Stress: Not on file  Relationships  . Social connections:    Talks on phone: Not on file    Gets together: Not on file    Attends religious service: Not on file    Active  member of club or organization: Not on file    Attends meetings of clubs or organizations: Not on file    Relationship status: Not on file  Other Topics Concern  . Not on file  Social History Narrative  . Not on file   Family History  Problem Relation Age of Onset  . Hypertension Mother   . Hypertension Father    Scheduled Meds: . artificial tears  1 application Both Eyes K8M  . aspirin  81 mg Per Tube Daily  . budesonide (PULMICORT) nebulizer solution  0.5 mg Nebulization BID  . chlorhexidine gluconate (MEDLINE KIT)  15 mL Mouth Rinse BID  . Chlorhexidine Gluconate Cloth  6 each Topical Daily  . enoxaparin (LOVENOX) injection  40 mg Subcutaneous Q24H  . insulin aspart  0-15 Units Subcutaneous Q4H  . ipratropium-albuterol  3 mL Nebulization Q6H  . mouth rinse  15 mL Mouth Rinse 10 times per day  . pantoprazole (PROTONIX) IV  40 mg Intravenous Q24H  . sodium chloride flush  10-40 mL Intracatheter Q12H   Continuous Infusions: . sodium chloride 10 mL/hr at 2019-05-11 0100  . feeding supplement (VITAL AF 1.2 CAL) 55 mL/hr at 05-11-19 0100  . piperacillin-tazobactam (ZOSYN)  IV 12.5 mL/hr at 05/11/2019 1000   PRN Meds:.sodium chloride, acetaminophen (TYLENOL) oral liquid 160 mg/5 mL, albuterol, sodium chloride flush Medications Prior to Admission:  Prior to Admission medications   Medication Sig Start Date End Date Taking? Authorizing Provider  DEXTROMETHORPHAN HBR PO Take 5 mLs by mouth as needed. Bromide chlorphenirameine maleate  This medication he brought from [place]   Yes [provider]  guaiFENesin (ROBITUSSIN) 100 MG/5ML SOLN Take 20 mLs by mouth once.   Yes [provider]  loratadine (CLARITIN) 10 MG tablet Take 10 mg by mouth once.   Yes [provider]   Allergies  Allergen Reactions  . Other Other (See Comments)    Yogurt and cold food Causes congestion   Review of Systems  Unable to perform ROS: Intubated    Physical Exam  Constitutional:      Appearance: Normal appearance.     Interventions: He is intubated.  Cardiovascular:     Rate and Rhythm: Tachycardia present.  Pulmonary:     Effort: He is intubated.  Skin:    General: Skin is warm and dry.     Vital Signs: BP 111/66   Pulse (!) 116   Temp 99.3 F (37.4 C) (Oral)   Resp 14   Ht '5\' 5"'$  (1.651 m)   Wt  70.5 kg   SpO2 96%   BMI 25.86 kg/m  Pain Scale: CPOT       SpO2: SpO2: 96 % O2 Device:SpO2: 96 % O2 Flow Rate: .   IO: Intake/output summary:   Intake/Output Summary (Last 24 hours) at May 05, 2019 1033 Last data filed at 05/05/19 1000 Gross per 24 hour  Intake 1284.96 ml  Output 985 ml  Net 299.96 ml    LBM: Last BM Date: 04/13/19 Baseline Weight: Weight: 81.6 kg Most recent weight: Weight: 70.5 kg     Palliative Assessment/Data:    Discussed with Brandy NP/CCM and bed  Time In: 1400 Time Out: 1530 Time Total: 90 minutes Greater than 50%  of this time was spent counseling and coordinating care related to the above assessment and plan.  Signed by: Wadie Lessen, NP   Please contact Palliative Medicine Team phone at [phone] for questions and concerns.  For individual provider: See Shea Evans

## 2019-05-09 NOTE — Progress Notes (Addendum)
NAMENicolis Austin, MRN:  [id number], DOB:  [birth year], LOS: 6 ADMISSION DATE:  05-08-2019, CONSULTATION DATE: May 08, 2019 REFERRING MD:  ER, CHIEF COMPLAINT: Status post cardiac arrest  Brief History   63 year old M visiting from [place] for the birth of a grandchild admitted 6/2 post witnessed arrest.  Delayed CPR (10-15 minutes), then CPR x10-15 per EMS. Vomited en route with concern for significant aspiration. Status post TTM.  Neuroimaging with diffuse anoxic injury.    Past Medical History  None known  Significant Hospital Events   6/02 Admit for cardiac arrest 6/07 Family discussions regarding poor prognosis  Consults:  Neurology   Procedures:  Intubation 6/2 > Central line 6/2 > Art line 6/2 >  Significant Diagnostic Tests:  CT head 6/2 > Negative intracranial imaging. No visible infarct or anoxic injury. Lateral atlantodental asymmetry of indeterminate age. If there was trauma, recommend cervical spine CT. Intubation with diffuse sinus fluid levels. EEG 6/2 > severely attenuated electroencephalogram consistent with sedation and hypothermia.  MRI 6/6 >> diffuse anoxic injury  Micro Data:  COVID neg Blood 6/2 > GPR aerobic only > negative Sputum 6/3 > negative  Antimicrobials:  Vancomycin 6/2 > 6/3 Zosyn 6/2 > 6/8  Interim history/subjective:  Tmax 99.3.  I/O - 1.1L UOP in last 24 hours / +189ml overall in last 24.  RN reports no acute changes.   Objective   Blood pressure 122/79, pulse (!) 116, temperature 99.3 F (37.4 C), temperature source Oral, resp. rate 14, height 5\' 5"  (1.651 m), weight 70.5 kg, SpO2 94 %. CVP:  [13 mmHg-25 mmHg] 25 mmHg  Vent Mode: PRVC FiO2 (%):  [40 %] 40 % Set Rate:  [14 bmp] 14 bmp Vt Set:  [400 mL] 400 mL PEEP:  [5 cmH20] 5 cmH20 Plateau Pressure:  [30 cmH20-39 cmH20] 36 cmH20   Intake/Output Summary (Last 24 hours) at 04/14/2019 2979 Last data filed at 05/01/2019 0800 Gross per 24 hour  Intake 1225.55 ml  Output 1040 ml  Net  185.55 ml   Filed Weights   04/14/19 0500 04/15/19 0500 05/01/2019 0325  Weight: 70.4 kg 70.8 kg 70.5 kg    Examination: General:  Critically ill appearing male lying in bed on vent in NAD HEENT: MM pink/moist, ETT, scleral edema / icterus Neuro: no response to verbal stimuli, no spontaneous respirations on PSV > triggered vent back up rate, +cough with deep suction, slight flexion to painful stimuli CV: s1s2 rrr, no m/r/g PULM: even/non-labored, vent assisted breaths GI: soft, non-tender, bsx4 active  Extremities: warm/dry, generalized 2+ edema  Skin: no rashes or lesions  Resolved Hospital Problem list   Shock -neurogenic vs sepsis with aspiration PNA  Assessment & Plan:   Cardiac Arrest -PEA arrest with ~30 minute downtime. TTM complete 6/3. Troponin trend peaked at 0.15. Severe Anoxic Brain Injury  -MRI demonstrates diffuse anoxic injury.  Restricted diffusion affecting cerebral cerebellar cortex and deep nuclei.  Increased intracranial pressure P: Appreciate Neurology input  Poor neurological prognosis given imaging / exam  Appreciate Palliative Care assistance Continue ASA Hold sedating medications for now to allow for neuro exam.  Aspiration PNA Gram positive rod bacteremia likely contaminant  P: D7/7 abx  Discontinue after 6/8 dosing complete  Monitor fever curve / WBC trend   Acute Hypoxemic Respiratory Failure  -due to aspiration PNA and likely a component of congestive heart failure, possibly acute in nature P: PRVC 8cc/kg  Mental status barrier for weaning / no pt effort  Follow intermittent CXR  Wean FiO2 for sats >90% Continue pulmicort, Duoneb, PRN albuterol   Hyperglycemia  -no history of DM P: SSI   AKI Hypocalcemia  P: Trend BMP / urinary output Replace electrolytes as indicated Avoid nephrotoxic agents, ensure adequate renal perfusion  Best practice:  Diet:TF Pain/Anxiety/Delirium protocol (if indicated): hold sedating medications for  now to allow for neuro exam VAP protocol (if indicated): Yes DVT prophylaxis: Yes GI prophylaxis: Pepcid Glucose control: Monitor Mobility: Bedrest Code Status: Full Family Communication: Palliative Care to update family 6/8.  Disposition: ICU  Labs   CBC: Recent Labs  Lab 09-02-19 0218  04/12/19 0500 04/13/19 0212 04/14/19 0340 04/15/19 0432 04/17/2019 0405  WBC 10.4   < > 6.6 6.9 5.4 5.6 6.0  NEUTROABS 6.6  --   --   --   --   --  4.3  HGB 12.9*   < > 11.7* 11.4* 9.4* 9.0* 8.6*  HCT 42.1   < > 34.3* 34.5* 29.1* 28.1* 27.0*  MCV 100.7*   < > 92.5 93.2 94.2 95.3 96.1  PLT 217   < > 202 199 155 158 180   < > = values in this interval not displayed.    Basic Metabolic Panel: Recent Labs  Lab 04/12/19 0209 04/12/19 0400 04/13/19 0212 04/14/19 0340 04/14/19 0709 04/15/19 0432 04/12/2019 0405  NA 134* 135 136  --  136  --  138  K 3.9 3.9 3.4*  --  3.8  --  4.3  CL 104 104 102  --  101  --  103  CO2 21* 21* 21*  --  24  --  25  GLUCOSE 110* 125* 162*  --  144*  --  144*  BUN 13 15 24*  --  31*  --  32*  CREATININE 1.44* 1.46* 1.96*  --  2.08*  --  1.71*  CALCIUM 7.0* 7.0* 7.5*  --  7.6*  --  7.8*  MG  --  2.6* 2.3 2.2  --  2.2 2.4  PHOS  --  4.5 5.2* 4.1  --  3.2 3.3   GFR: Estimated Creatinine Clearance: 38 mL/min (A) (by C-G formula based on SCr of 1.71 mg/dL (H)). Recent Labs  Lab 09-02-19 0218 09-02-19 0417  04/13/19 0212 04/13/19 0938 04/14/19 0340 04/15/19 0432 04/30/2019 0405  PROCALCITON <0.10  --   --   --  7.75 5.62 3.50  --   WBC 10.4  --    < > 6.9  --  5.4 5.6 6.0  LATICACIDVEN 10.6* 8.2*  --   --   --   --   --   --    < > = values in this interval not displayed.    Liver Function Tests: Recent Labs  Lab 09-02-19 0218 04/11/19 0510  AST 79* 53*  ALT 56* 38  ALKPHOS 64 46  BILITOT 0.8 1.5*  PROT 6.5 5.1*  ALBUMIN 3.4* 2.4*   No results for input(s): LIPASE, AMYLASE in the last 168 hours. No results for input(s): AMMONIA in the last 168  hours.  ABG    Component Value Date/Time   PHART 7.338 (L) 04/11/2019 0517   PCO2ART 41.3 04/11/2019 0517   PO2ART 68.0 (L) 04/11/2019 0517   HCO3 23.2 04/11/2019 0517   TCO2 25 04/11/2019 0517   ACIDBASEDEF 4.0 (H) 04/11/2019 0517   O2SAT 95.0 04/11/2019 0517     Coagulation Profile: Recent Labs  Lab 09-02-19 0417 09-02-19 1142  INR 1.1 1.3*  Cardiac Enzymes: Recent Labs  Lab 04/09/2019 0218 04/17/2019 0417 04/13/2019 0801 04/27/2019 1704 04/29/2019 2213  TROPONINI 0.05* 0.09* 0.13* 0.15* 0.12*    HbA1C: No results found for: HGBA1C  CBG: Recent Labs  Lab 04/15/19 1539 04/15/19 1918 04/15/19 2333 Jul 18, 2019 0321 Jul 18, 2019 0840  GLUCAP 133* 105* 135* 124* 127*     CC Time: 30 minutes    Canary BrimBrandi Ollis, NP-C [facility] Pgr: [phone] or if no answer [phone] 04/23/2019, 9:29 AM    PCCM Attending:   63 yo from [place], s/p cardiac arrest, now with anoxic brain injury. Palliative care meeting has already occurred. Current plan is for palliative withdrawal this afternoon once family are ready.   Discussed with nursing staff. Orders for withdrawal of care have been placed. We are awaiting family arrival.   Please call CCM or Palliative with any questions.   Agree with documentation per NP as above.    Josephine IgoBradley L , DO [facility] 05/07/2019 11:49 AM

## 2019-05-09 NOTE — Procedures (Signed)
Extubation Procedure Note  Patient Details:   Name: Adam Austin DOB: 1956-11-01 MRN: [id number]   Airway Documentation:    Vent end date: May 10, 2019 Vent end time: 1645   Evaluation  O2 sats: stable throughout Complications: No apparent complications Patient did tolerate procedure well.  PT terminally extubated to room air per MD order   No  Levora Dredge 05-10-2019, 4:49 PM

## 2019-05-09 NOTE — Progress Notes (Signed)
Family wishes to use the following funeral home:  [facility] [address] [place] [address] [phone]

## 2019-05-09 DEATH — deceased

## 2019-10-06 IMAGING — DX PORTABLE CHEST - 1 VIEW
1 series · 1 of 1 positions shown · non-contrast
Comparison: 04/13/2019

CLINICAL DATA: Respiratory failure

EXAM:
PORTABLE CHEST 1 VIEW

[chest ap]
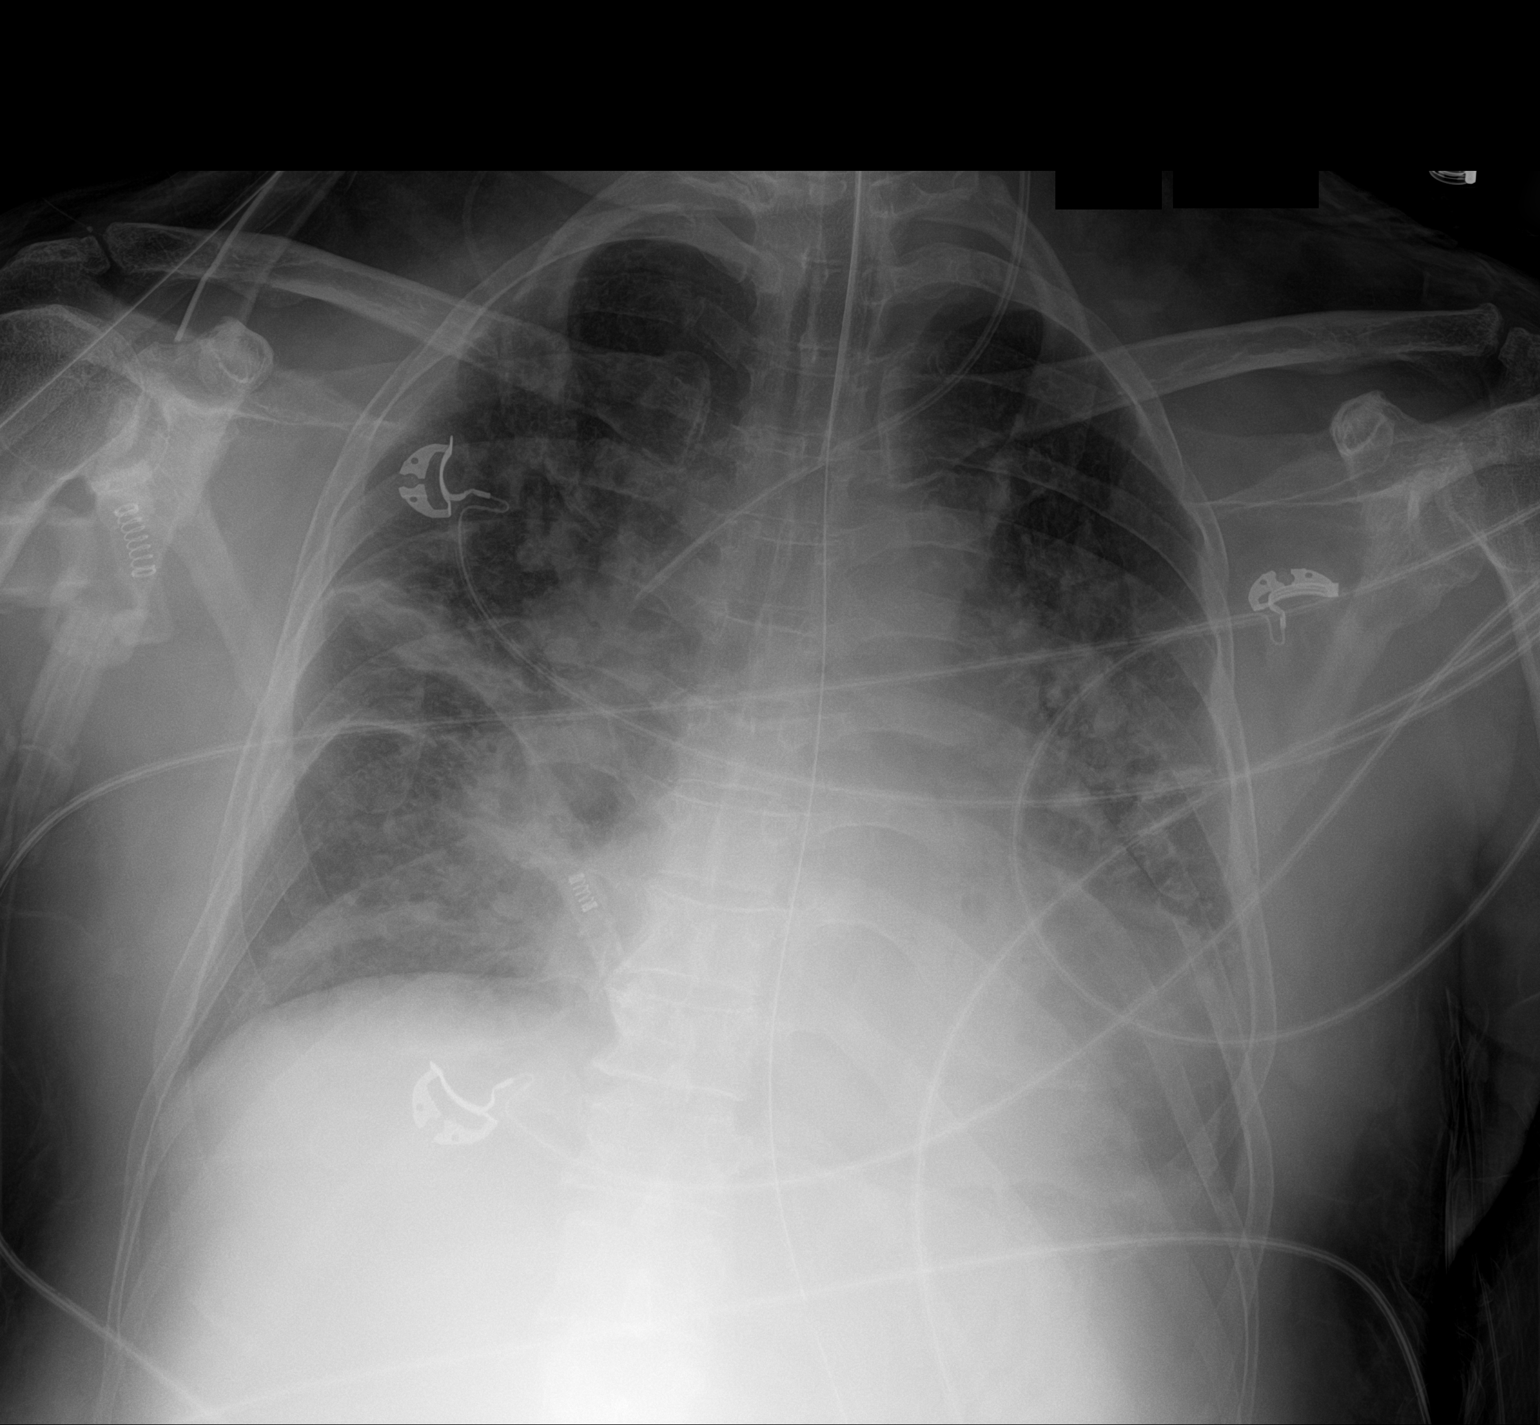

[1 of 1 positions shown; findings below may reference images not displayed]

FINDINGS: Cardiac shadow is stable. Endotracheal tube, nasogastric catheter
and left jugular central line are again seen and stable. Patchy
infiltrative changes are again seen bilaterally and stable from the
prior exam. No new abnormality is seen.
IMPRESSION: Stable bilateral infiltrates.  No new focal abnormality is noted.
